# Patient Record
Sex: Male | Born: 1970 | Race: White | Hispanic: No | Marital: Single | Smoking: Never smoker
Health system: Southern US, Community
[De-identification: ages and names within clinical notes are randomized; demographics above are authoritative.]

## PROBLEM LIST (undated history)

## (undated) DIAGNOSIS — E785 Hyperlipidemia, unspecified: Secondary | ICD-10-CM

## (undated) HISTORY — PX: KNEE SURGERY: SHX244

## (undated) HISTORY — DX: Hyperlipidemia, unspecified: E78.5

---

## 2008-04-26 ENCOUNTER — Emergency Department: Payer: Self-pay | Admitting: Emergency Medicine

## 2008-04-26 ENCOUNTER — Other Ambulatory Visit: Payer: Self-pay

## 2008-08-11 DIAGNOSIS — D239 Other benign neoplasm of skin, unspecified: Secondary | ICD-10-CM

## 2008-08-11 HISTORY — DX: Other benign neoplasm of skin, unspecified: D23.9

## 2012-06-01 ENCOUNTER — Emergency Department: Payer: Self-pay | Admitting: Emergency Medicine

## 2012-06-01 LAB — URINALYSIS, COMPLETE
Bacteria: NONE SEEN
Nitrite: NEGATIVE
Ph: 6 (ref 4.5–8.0)
Protein: 30
RBC,UR: 7 /HPF (ref 0–5)
Specific Gravity: 1.027 (ref 1.003–1.030)
WBC UR: 2 /HPF (ref 0–5)

## 2012-06-01 LAB — CBC
HCT: 46.8 % (ref 40.0–52.0)
HGB: 15.8 g/dL (ref 13.0–18.0)
MCHC: 33.7 g/dL (ref 32.0–36.0)
MCV: 87 fL (ref 80–100)
WBC: 8.8 10*3/uL (ref 3.8–10.6)

## 2012-06-01 LAB — BASIC METABOLIC PANEL
Calcium, Total: 9.2 mg/dL (ref 8.5–10.1)
Co2: 22 mmol/L (ref 21–32)
Creatinine: 1.29 mg/dL (ref 0.60–1.30)
Glucose: 100 mg/dL — ABNORMAL HIGH (ref 65–99)
Potassium: 4.1 mmol/L (ref 3.5–5.1)
Sodium: 137 mmol/L (ref 136–145)

## 2013-04-28 DIAGNOSIS — D239 Other benign neoplasm of skin, unspecified: Secondary | ICD-10-CM

## 2013-04-28 HISTORY — DX: Other benign neoplasm of skin, unspecified: D23.9

## 2013-09-17 DIAGNOSIS — H179 Unspecified corneal scar and opacity: Secondary | ICD-10-CM | POA: Insufficient documentation

## 2013-09-17 DIAGNOSIS — H269 Unspecified cataract: Secondary | ICD-10-CM | POA: Insufficient documentation

## 2013-09-17 DIAGNOSIS — H5319 Other subjective visual disturbances: Secondary | ICD-10-CM | POA: Insufficient documentation

## 2015-07-26 ENCOUNTER — Telehealth: Payer: Self-pay | Admitting: Family Medicine

## 2015-07-26 NOTE — Telephone Encounter (Signed)
Spoke with patient and confirmed that we do have access to his [employer] records from Dr. Miles Costain

## 2015-07-26 NOTE — Telephone Encounter (Signed)
Was a patient of Dr Danielle Dess. He has not found a new primary care doctor yet but would like for you to return his call. He would just like to know if we have access to his records from [facility]. I told him that Dr Manuella Ghazi does have access but he would like to make sure we can access them

## 2015-07-30 ENCOUNTER — Encounter: Payer: Self-pay | Admitting: Family Medicine

## 2015-07-30 ENCOUNTER — Ambulatory Visit (INDEPENDENT_AMBULATORY_CARE_PROVIDER_SITE_OTHER): Payer: BLUE CROSS/BLUE SHIELD | Admitting: Family Medicine

## 2015-07-30 ENCOUNTER — Ambulatory Visit
Admission: RE | Admit: 2015-07-30 | Discharge: 2015-07-30 | Disposition: A | Discharge status: Home / Self Care | Payer: BLUE CROSS/BLUE SHIELD | Source: Ambulatory Visit | Attending: Family Medicine | Admitting: Family Medicine

## 2015-07-30 VITALS — BP 110/70 | HR 89 | Temp 98.3°F | Resp 18 | Ht 74.0 in | Wt 206.1 lb

## 2015-07-30 DIAGNOSIS — M799 Soft tissue disorder, unspecified: Secondary | ICD-10-CM | POA: Insufficient documentation

## 2015-07-30 DIAGNOSIS — M7989 Other specified soft tissue disorders: Secondary | ICD-10-CM

## 2015-07-30 MED ORDER — CLINDAMYCIN HCL 300 MG PO CAPS
300.0000 mg | ORAL_CAPSULE | Freq: Four times a day (QID) | ORAL | Status: DC
Start: 2015-07-30 — End: 2021-08-25

## 2015-07-30 MED ORDER — NAPROXEN 500 MG PO TABS: 500.0000 mg | ORAL_TABLET | Freq: Two times a day (BID) | ORAL | Status: DC

## 2015-07-30 NOTE — Progress Notes (Signed)
Name: Victor Mora   MRN: [id number]    DOB: 27-Mar-1971   Date:07/30/2015       Progress Note  Subjective  Chief Complaint  Chief Complaint  Patient presents with  . Acute Visit    Bridge of nose swollen x4 days    HPI   Pt. With pain and swelling over the bridge of the nose. Present for last 5 days. Symptoms have progressively gotten worse over the course of the week. He had similar symptoms 3 weeks ago when he put on a new pair of sunglasses but those went away.  No trauma to nose or head, no nasal drainage, no fevers or chills, but have had headaches. Symptoms have marginally improved this AM but pain was worse last night.  Past Medical History  Diagnosis Date  . Hyperlipidemia     Past Surgical History  Procedure Laterality Date  . Knee surgery Right     History reviewed. No pertinent family history.  Social History   Social History  . Marital Status: Married    Spouse Name: N/A  . Number of Children: N/A  . Years of Education: N/A   Occupational History  . Not on file.   Social History Main Topics  . Smoking status: Never Smoker   . Smokeless tobacco: Not on file  . Alcohol Use: No  . Drug Use: No  . Sexual Activity: Yes   Other Topics Concern  . Not on file   Social History Narrative  . No narrative on file    No current outpatient prescriptions on file.  No Known Allergies   Review of Systems  Constitutional: Negative for fever and chills.  HENT: Negative for congestion, nosebleeds and sore throat.   Neurological: Positive for headaches.   Objective  Filed Vitals:   07/30/15 1058  BP: 110/70  Pulse: 89  Temp: 98.3 F (36.8 C)  TempSrc: Oral  Resp: 18  Height: 6\' 2"  (1.88 m)  Weight: 206 lb 1.6 oz (93.486 kg)  SpO2: 97%    Physical Exam  Constitutional: He is well-developed, well-nourished, and in no distress.  HENT:  Nose: Mucosal edema and sinus tenderness present. No rhinorrhea or nose lacerations. Right sinus exhibits no  frontal sinus tenderness. Left sinus exhibits no frontal sinus tenderness.  Mouth/Throat: No posterior oropharyngeal erythema.  Tenderness to palpation and erythema over the nasal bridge  Cardiovascular: Normal rate and regular rhythm.   Pulmonary/Chest: Effort normal and breath sounds normal.  Nursing note and vitals reviewed.   Assessment & Plan  1. Inflammation of soft tissue Nonspecific inflammation versus infection (cellulitis) at the bridge of the nose. Obtain x-ray of the face, and start patient on antibiotic and NSAID therapy. Obtain CBC for evaluation of white count. Follow-up after lab and imaging workup.  - DG Facial Bones Complete; Future - CBC with Differential - clindamycin (CLEOCIN) 300 MG capsule; Take 1 capsule (300 mg total) by mouth 4 (four) times daily.  Dispense: 28 capsule; Refill: 0 - naproxen (NAPROSYN) 500 MG tablet; Take 1 tablet (500 mg total) by mouth 2 (two) times daily with a meal.  Dispense: 10 tablet; Refill: 0   [facility] 07/30/2015 11:15 AM

## 2015-07-31 LAB — CBC WITH DIFFERENTIAL/PLATELET
Basophils Absolute: 0 10*3/uL (ref 0.0–0.2)
Basos: 0 %
EOS (ABSOLUTE): 0.2 10*3/uL (ref 0.0–0.4)
EOS: 2 %
HEMATOCRIT: 43.6 % (ref 37.5–51.0)
Hemoglobin: 14.9 g/dL (ref 12.6–17.7)
Immature Grans (Abs): 0 10*3/uL (ref 0.0–0.1)
Immature Granulocytes: 0 %
LYMPHS ABS: 2.4 10*3/uL (ref 0.7–3.1)
Lymphs: 25 %
MCH: 29.6 pg (ref 26.6–33.0)
MCHC: 34.2 g/dL (ref 31.5–35.7)
MCV: 87 fL (ref 79–97)
MONOCYTES: 7 %
Monocytes Absolute: 0.7 10*3/uL (ref 0.1–0.9)
NEUTROS ABS: 6.4 10*3/uL (ref 1.4–7.0)
Neutrophils: 66 %
Platelets: 245 10*3/uL (ref 150–379)
RBC: 5.04 x10E6/uL (ref 4.14–5.80)
RDW: 12.8 % (ref 12.3–15.4)
WBC: 9.7 10*3/uL (ref 3.4–10.8)

## 2016-04-14 DIAGNOSIS — N200 Calculus of kidney: Secondary | ICD-10-CM | POA: Insufficient documentation

## 2017-10-17 ENCOUNTER — Other Ambulatory Visit: Payer: Self-pay | Admitting: Sports Medicine

## 2017-10-17 DIAGNOSIS — M7702 Medial epicondylitis, left elbow: Secondary | ICD-10-CM

## 2017-10-24 ENCOUNTER — Other Ambulatory Visit: Payer: Self-pay | Admitting: Sports Medicine

## 2017-10-24 DIAGNOSIS — M7702 Medial epicondylitis, left elbow: Secondary | ICD-10-CM

## 2017-10-26 ENCOUNTER — Other Ambulatory Visit: Payer: BLUE CROSS/BLUE SHIELD

## 2017-10-26 ENCOUNTER — Ambulatory Visit: Payer: BLUE CROSS/BLUE SHIELD

## 2017-10-27 ENCOUNTER — Ambulatory Visit
Admission: RE | Admit: 2017-10-27 | Discharge: 2017-10-27 | Disposition: A | Discharge status: Home / Self Care | Payer: BLUE CROSS/BLUE SHIELD | Source: Ambulatory Visit | Attending: Sports Medicine | Admitting: Sports Medicine

## 2017-10-27 DIAGNOSIS — M7702 Medial epicondylitis, left elbow: Secondary | ICD-10-CM

## 2020-05-24 ENCOUNTER — Ambulatory Visit (INDEPENDENT_AMBULATORY_CARE_PROVIDER_SITE_OTHER): Payer: 59 | Admitting: Dermatology

## 2020-05-24 ENCOUNTER — Other Ambulatory Visit: Payer: Self-pay

## 2020-05-24 DIAGNOSIS — D1800 Hemangioma unspecified site: Secondary | ICD-10-CM

## 2020-05-24 DIAGNOSIS — L820 Inflamed seborrheic keratosis: Secondary | ICD-10-CM

## 2020-05-24 DIAGNOSIS — L814 Other melanin hyperpigmentation: Secondary | ICD-10-CM

## 2020-05-24 DIAGNOSIS — L578 Other skin changes due to chronic exposure to nonionizing radiation: Secondary | ICD-10-CM

## 2020-05-24 DIAGNOSIS — Z1283 Encounter for screening for malignant neoplasm of skin: Secondary | ICD-10-CM

## 2020-05-24 DIAGNOSIS — L821 Other seborrheic keratosis: Secondary | ICD-10-CM

## 2020-05-24 DIAGNOSIS — D229 Melanocytic nevi, unspecified: Secondary | ICD-10-CM

## 2020-05-24 DIAGNOSIS — Z86018 Personal history of other benign neoplasm: Secondary | ICD-10-CM

## 2020-05-24 NOTE — Progress Notes (Signed)
   Follow-Up Visit   Subjective  Victor Mora is a 49 y.o. male who presents for the following: Annual Exam (History of multiple dysplastic nevi - TBSE). The patient presents for Total-Body Skin Exam (TBSE) for skin cancer screening and mole check.  The following portions of the chart were reviewed this encounter and updated as appropriate:  Tobacco  Allergies  Meds  Problems  Med Hx  Surg Hx  Fam Hx      Review of Systems:  No other skin or systemic complaints except as noted in HPI or Assessment and Plan.  Objective  Well appearing patient in no apparent distress; mood and affect are within normal limits.  A full examination was performed including scalp, head, eyes, ears, nose, lips, neck, chest, axillae, abdomen, back, buttocks, bilateral upper extremities, bilateral lower extremities, hands, feet, fingers, toes, fingernails, and toenails. All findings within normal limits unless otherwise noted below.  Objective  Right medial infrascapular x 1, right temple x 3, left temple x 2 (6): Erythematous keratotic or waxy stuck-on papule or plaque.    Assessment & Plan    Lentigines - Scattered tan macules - Discussed due to sun exposure - Benign, observe - Call for any changes  Seborrheic Keratoses - Stuck-on, waxy, tan-brown papules and plaques  - Discussed benign etiology and prognosis. - Observe - Call for any changes  Melanocytic Nevi - Tan-brown and/or pink-flesh-colored symmetric macules and papules - Benign appearing on exam today - Observation - Call clinic for new or changing moles - Recommend daily use of broad spectrum spf 30+ sunscreen to sun-exposed areas.   Hemangiomas - Red papules - Discussed benign nature - Observe - Call for any changes  Actinic Damage - diffuse scaly erythematous macules with underlying dyspigmentation - Recommend daily broad spectrum sunscreen SPF 30+ to sun-exposed areas, reapply every 2 hours as needed.  - Call for new  or changing lesions.  Skin cancer screening performed today.  History of Dysplastic Nevi - No evidence of recurrence today - Recommend regular full body skin exams - Recommend daily broad spectrum sunscreen SPF 30+ to sun-exposed areas, reapply every 2 hours as needed.  - Call if any new or changing lesions are noted between office visits  Inflamed seborrheic keratosis (6) Right medial infrascapular x 1, right temple x 3, left temple x 2  Destruction of lesion - Right medial infrascapular x 1, right temple x 3, left temple x 2 Complexity: simple   Destruction method: cryotherapy   Informed consent: discussed and consent obtained   Timeout:  patient name, date of birth, surgical site, and procedure verified Lesion destroyed using liquid nitrogen: Yes   Region frozen until ice ball extended beyond lesion: Yes   Outcome: patient tolerated procedure well with no complications   Post-procedure details: wound care instructions given    Skin cancer screening  Return in about 6 months (around 11/23/2020).  I, Ashok Cordia, CMA, am acting as scribe for Sarina Ser, MD .  Documentation: I have reviewed the above documentation for accuracy and completeness, and I agree with the above.  Sarina Ser, MD

## 2020-05-24 NOTE — Patient Instructions (Signed)

## 2020-05-27 ENCOUNTER — Encounter: Payer: Self-pay | Admitting: Dermatology

## 2020-11-11 ENCOUNTER — Ambulatory Visit (INDEPENDENT_AMBULATORY_CARE_PROVIDER_SITE_OTHER): Payer: 59 | Admitting: Dermatology

## 2020-11-11 ENCOUNTER — Other Ambulatory Visit: Payer: Self-pay

## 2020-11-11 DIAGNOSIS — L814 Other melanin hyperpigmentation: Secondary | ICD-10-CM | POA: Diagnosis not present

## 2020-11-11 DIAGNOSIS — D1800 Hemangioma unspecified site: Secondary | ICD-10-CM

## 2020-11-11 DIAGNOSIS — D2262 Melanocytic nevi of left upper limb, including shoulder: Secondary | ICD-10-CM | POA: Diagnosis not present

## 2020-11-11 DIAGNOSIS — D229 Melanocytic nevi, unspecified: Secondary | ICD-10-CM

## 2020-11-11 DIAGNOSIS — D485 Neoplasm of uncertain behavior of skin: Secondary | ICD-10-CM

## 2020-11-11 DIAGNOSIS — Z1283 Encounter for screening for malignant neoplasm of skin: Secondary | ICD-10-CM | POA: Diagnosis not present

## 2020-11-11 DIAGNOSIS — L578 Other skin changes due to chronic exposure to nonionizing radiation: Secondary | ICD-10-CM

## 2020-11-11 DIAGNOSIS — D239 Other benign neoplasm of skin, unspecified: Secondary | ICD-10-CM | POA: Insufficient documentation

## 2020-11-11 DIAGNOSIS — Z86018 Personal history of other benign neoplasm: Secondary | ICD-10-CM

## 2020-11-11 DIAGNOSIS — L821 Other seborrheic keratosis: Secondary | ICD-10-CM | POA: Diagnosis not present

## 2020-11-11 NOTE — Patient Instructions (Signed)

## 2020-11-11 NOTE — Progress Notes (Signed)
   Follow-Up Visit   Subjective  Victor Mora is a 49 y.o. male who presents for the following: Annual Exam (Hx dysplastic nevi). The patient presents for Total-Body Skin Exam (TBSE) for skin cancer screening and mole check.  The following portions of the chart were reviewed this encounter and updated as appropriate:   Tobacco  Allergies  Meds  Problems  Med Hx  Surg Hx  Fam Hx     Review of Systems:  No other skin or systemic complaints except as noted in HPI or Assessment and Plan.  Objective  Well appearing patient in no apparent distress; mood and affect are within normal limits.  A full examination was performed including scalp, head, eyes, ears, nose, lips, neck, chest, axillae, abdomen, back, buttocks, bilateral upper extremities, bilateral lower extremities, hands, feet, fingers, toes, fingernails, and toenails. All findings within normal limits unless otherwise noted below.  Objective  L infra axillary: 0.6 cm Irregular brown macule with darker center  Assessment & Plan  Neoplasm of uncertain behavior of skin L infra axillary  Epidermal / dermal shaving  Lesion diameter (cm):  0.6 Informed consent: discussed and consent obtained   Timeout: patient name, date of birth, surgical site, and procedure verified   Procedure prep:  Patient was prepped and draped in usual sterile fashion Prep type:  Isopropyl alcohol Anesthesia: the lesion was anesthetized in a standard fashion   Anesthetic:  1% lidocaine w/ epinephrine 1-100,000 buffered w/ 8.4% NaHCO3 Instrument used: flexible razor blade   Hemostasis achieved with: pressure, aluminum chloride and electrodesiccation   Outcome: patient tolerated procedure well   Post-procedure details: sterile dressing applied and wound care instructions given   Dressing type: bandage and petrolatum    Specimen 1 - Surgical pathology Differential Diagnosis: D48.5 r/o dysplastic nevus  Check Margins: No 0.6 cm Irregular brown macule  with darker center  Skin cancer screening   Lentigines - Scattered tan macules - Discussed due to sun exposure - Benign, observe - Call for any changes  Seborrheic Keratoses - Stuck-on, waxy, tan-brown papules and plaques  - Discussed benign etiology and prognosis. - Observe - Call for any changes  Melanocytic Nevi - Tan-brown and/or pink-flesh-colored symmetric macules and papules - Benign appearing on exam today - Observation - Call clinic for new or changing moles - Recommend daily use of broad spectrum spf 30+ sunscreen to sun-exposed areas.   Hemangiomas - Red papules - Discussed benign nature - Observe - Call for any changes  Actinic Damage - Chronic, secondary to cumulative UV/sun exposure - diffuse scaly erythematous macules with underlying dyspigmentation - Recommend daily broad spectrum sunscreen SPF 30+ to sun-exposed areas, reapply every 2 hours as needed.  - Call for new or changing lesions.  History of Dysplastic Nevi - No evidence of recurrence today - Recommend regular full body skin exams - Recommend daily broad spectrum sunscreen SPF 30+ to sun-exposed areas, reapply every 2 hours as needed.  - Call if any new or changing lesions are noted between office visits  Skin cancer screening performed today.  Return in about 6 months (around 05/12/2021) for TBSE - hx dyplastic nevi .  Luther Redo, CMA, am acting as scribe for Sarina Ser, MD .  Documentation: I have reviewed the above documentation for accuracy and completeness, and I agree with the above.  Sarina Ser, MD

## 2020-11-15 ENCOUNTER — Encounter: Payer: Self-pay | Admitting: Dermatology

## 2020-11-16 ENCOUNTER — Telehealth: Payer: Self-pay

## 2020-11-16 NOTE — Telephone Encounter (Signed)
Discussed biopsy results with pt  °

## 2021-05-05 ENCOUNTER — Encounter: Payer: Self-pay | Admitting: Dermatology

## 2021-05-05 ENCOUNTER — Ambulatory Visit (INDEPENDENT_AMBULATORY_CARE_PROVIDER_SITE_OTHER): Payer: 59 | Admitting: Dermatology

## 2021-05-05 ENCOUNTER — Other Ambulatory Visit: Payer: Self-pay

## 2021-05-05 DIAGNOSIS — D225 Melanocytic nevi of trunk: Secondary | ICD-10-CM

## 2021-05-05 DIAGNOSIS — Z86018 Personal history of other benign neoplasm: Secondary | ICD-10-CM | POA: Diagnosis not present

## 2021-05-05 DIAGNOSIS — L578 Other skin changes due to chronic exposure to nonionizing radiation: Secondary | ICD-10-CM

## 2021-05-05 DIAGNOSIS — L858 Other specified epidermal thickening: Secondary | ICD-10-CM | POA: Diagnosis not present

## 2021-05-05 DIAGNOSIS — Z1283 Encounter for screening for malignant neoplasm of skin: Secondary | ICD-10-CM | POA: Diagnosis not present

## 2021-05-05 DIAGNOSIS — D239 Other benign neoplasm of skin, unspecified: Secondary | ICD-10-CM

## 2021-05-05 DIAGNOSIS — L821 Other seborrheic keratosis: Secondary | ICD-10-CM

## 2021-05-05 DIAGNOSIS — D229 Melanocytic nevi, unspecified: Secondary | ICD-10-CM

## 2021-05-05 DIAGNOSIS — D1800 Hemangioma unspecified site: Secondary | ICD-10-CM

## 2021-05-05 DIAGNOSIS — L814 Other melanin hyperpigmentation: Secondary | ICD-10-CM

## 2021-05-05 NOTE — Patient Instructions (Addendum)
If you have any questions or concerns for your doctor, please call our main line at [phone] and press option 4 to reach your doctor's medical assistant. If no one answers, please leave a voicemail as directed and we will return your call as soon as possible. Messages left after 4 pm will be answered the following business day.   You may also send us a message via MyChart. We typically respond to MyChart messages within 1-2 business days.  For prescription refills, please ask your pharmacy to contact our office. Our fax number is [phone].  If you have an urgent issue when the clinic is closed that cannot wait until the next business day, you can page your doctor at the number below.    Please note that while we do our best to be available for urgent issues outside of office hours, we are not available 24/7.   If you have an urgent issue and are unable to reach us, you may choose to seek medical care at your doctor's office, retail clinic, urgent care center, or emergency room.  If you have a medical emergency, please immediately call 911 or go to the emergency department.  Pager Numbers  - Dr. Kowalski: [phone]  - Dr. Moye: [phone]  - Dr. Stewart: [phone]  In the event of inclement weather, please call our main line at [phone] for an update on the status of any delays or closures.  Dermatology Medication Tips: Please keep the boxes that topical medications come in in order to help keep track of the instructions about where and how to use these. Pharmacies typically print the medication instructions only on the boxes and not directly on the medication tubes.   If your medication is too expensive, please contact our office at [phone] option 4 or send us a message through MyChart.   We are unable to tell what your co-pay for medications will be in advance as this is different depending on your insurance coverage. However, we may be able to find a substitute  medication at lower cost or fill out paperwork to get insurance to cover a needed medication.   If a prior authorization is required to get your medication covered by your insurance company, please allow us 1-2 business days to complete this process.  Drug prices often vary depending on where the prescription is filled and some pharmacies may offer cheaper prices.  The website www.goodrx.com contains coupons for medications through different pharmacies. The prices here do not account for what the cost may be with help from insurance (it may be cheaper with your insurance), but the website can give you the price if you did not use any insurance.  - You can print the associated coupon and take it with your prescription to the pharmacy.  - You may also stop by our office during regular business hours and pick up a GoodRx coupon card.  - If you need your prescription sent electronically to a different pharmacy, notify our office through [facility] MyChart or by phone at [phone] option 4.   Wound Care Instructions  Cleanse wound gently with soap and water once a day then pat dry with clean gauze. Apply a thing coat of Petrolatum (petroleum jelly, "Vaseline") over the wound (unless you have an allergy to this). We recommend that you use a new, sterile tube of Vaseline. Do not pick or remove scabs. Do not remove the yellow or white "healing tissue" from the base of the wound.  Cover the   wound with fresh, clean, nonstick gauze and secure with paper tape. You may use Band-Aids in place of gauze and tape if the would is small enough, but would recommend trimming much of the tape off as there is often too much. Sometimes Band-Aids can irritate the skin.  You should call the office for your biopsy report after 1 week if you have not already been contacted.  If you experience any problems, such as abnormal amounts of bleeding, swelling, significant bruising, significant pain, or evidence of infection,  please call the office immediately.  FOR ADULT SURGERY PATIENTS: If you need something for pain relief you may take 1 extra strength Tylenol (acetaminophen) AND 2 Ibuprofen (200mg each) together every 4 hours as needed for pain. (do not take these if you are allergic to them or if you have a reason you should not take them.) Typically, you may only need pain medication for 1 to 3 days.    

## 2021-05-05 NOTE — Progress Notes (Signed)
Follow-Up Visit   Subjective  Victor Mora is a 50 y.o. male who presents for the following: Total body skin exam (Hx of Dysplastic nevi) and hx of dysplastic nevu with repigmentation (L infra axillary, bx 11/11/20). The patient presents for Total-Body Skin Exam (TBSE) for skin cancer screening and mole check.  The following portions of the chart were reviewed this encounter and updated as appropriate:   Tobacco  Allergies  Meds  Problems  Med Hx  Surg Hx  Fam Hx      Review of Systems:  No other skin or systemic complaints except as noted in HPI or Assessment and Plan.  Objective  Well appearing patient in no apparent distress; mood and affect are within normal limits.  A full examination was performed including scalp, head, eyes, ears, nose, lips, neck, chest, axillae, abdomen, back, buttocks, bilateral upper extremities, bilateral lower extremities, hands, feet, fingers, toes, fingernails, and toenails. All findings within normal limits unless otherwise noted below.  L infra axillary Scar with brown macule within 0.3cm  multiple Scars with no evidence of recurrence.   Bil flanks Tiny follicular keratotic papules.    Assessment & Plan   Lentigines - Scattered tan macules - Due to sun exposure - Benign-appering, observe - Recommend daily broad spectrum sunscreen SPF 30+ to sun-exposed areas, reapply every 2 hours as needed. - Call for any changes  Seborrheic Keratoses - Stuck-on, waxy, tan-brown papules and/or plaques  - Benign-appearing - Discussed benign etiology and prognosis. - Observe - Call for any changes  Melanocytic Nevi - Tan-brown and/or pink-flesh-colored symmetric macules and papules - Benign appearing on exam today - Observation - Call clinic for new or changing moles - Recommend daily use of broad spectrum spf 30+ sunscreen to sun-exposed areas.   Hemangiomas - Red papules - Discussed benign nature - Observe - Call for any  changes  Actinic Damage - Chronic condition, secondary to cumulative UV/sun exposure - diffuse scaly erythematous macules with underlying dyspigmentation - Recommend daily broad spectrum sunscreen SPF 30+ to sun-exposed areas, reapply every 2 hours as needed.  - Staying in the shade or wearing long sleeves, sun glasses (UVA+UVB protection) and wide brim hats (4-inch brim around the entire circumference of the hat) are also recommended for sun protection.  - Call for new or changing lesions.  Skin cancer screening performed today.  Dysplastic nevus L infra axillary  Bx proven 11/11/20 With repigmentation  Skin excision - L infra axillary  Lesion length (cm):  0.3 Lesion width (cm):  0.3 Margin per side (cm):  0 Total excision diameter (cm):  0.3 Informed consent: discussed and consent obtained   Timeout: patient name, date of birth, surgical site, and procedure verified   Procedure prep:  Patient was prepped and draped in usual sterile fashion Prep type:  Isopropyl alcohol Anesthesia: the lesion was anesthetized in a standard fashion   Anesthetic:  1% lidocaine w/ epinephrine 1-100,000 buffered w/ 8.4% NaHCO3 Instrument used comment:  4.61mm punch Hemostasis achieved with: pressure   Hemostasis achieved with comment:  Electrocautery Outcome: patient tolerated procedure well with no complications   Post-procedure details: sterile dressing applied and wound care instructions given   Dressing type: bandage, pressure dressing and bacitracin (Mupirocin)    Skin repair - L infra axillary Complexity:  Simple Final length (cm):  0.3 Informed consent: discussed and consent obtained   Timeout: patient name, date of birth, surgical site, and procedure verified   Procedure prep:  Patient was prepped  and draped in usual sterile fashion Prep type:  Isopropyl alcohol Anesthesia: the lesion was anesthetized in a standard fashion   Anesthetic:  1% lidocaine w/ epinephrine 1-100,000 buffered  w/ 8.4% NaHCO3 Undermining: edges could be approximated without difficulty   Fine/surface layer approximation (top stitches):  Suture size:  4-0 Suture type: nylon   Suture type comment:  Nylon Suture removal (days):  7 Hemostasis achieved with: pressure and electrodesiccation Outcome: patient tolerated procedure well with no complications   Post-procedure details: sterile dressing applied and wound care instructions given   Dressing type: bandage, pressure dressing and bacitracin (Mupirocin)    Specimen 1 - Surgical pathology Differential Diagnosis: D48.5 Repigmented Dysplastic nevus  Check Margins: yes Scar with brown macule within 0.3cm [id number]   History of dysplastic nevus multiple  Clear. Observe for recurrence. Call clinic for new or changing lesions.  Recommend regular skin exams, daily broad-spectrum spf 30+ sunscreen use, and photoprotection.     Keratosis pilaris Bil flanks  Benign, observe  Skin cancer screening  Return in about 1 week (around 05/12/2021) for nurses schedule for suture removal, 16m TBSE , Hx of Dysplastic nevi.  I, [name], RMA, am acting as scribe for Sarina Ser, MD . Documentation: I have reviewed the above documentation for accuracy and completeness, and I agree with the above.  Sarina Ser, MD

## 2021-05-09 ENCOUNTER — Encounter: Payer: Self-pay | Admitting: Dermatology

## 2021-05-12 ENCOUNTER — Telehealth: Payer: Self-pay

## 2021-05-12 ENCOUNTER — Ambulatory Visit: Payer: 59

## 2021-05-12 ENCOUNTER — Other Ambulatory Visit: Payer: Self-pay

## 2021-05-12 DIAGNOSIS — Z4802 Encounter for removal of sutures: Secondary | ICD-10-CM

## 2021-05-12 NOTE — Progress Notes (Signed)
1. Encounter for removal of sutures left infra axillary Incision site is clean, dry and intact   Encounter for Removal of Sutures - Incision site at the left infra axillary is clean, dry and intact - Wound cleansed, sutures removed, wound cleansed, mupirocin and bandage applied.  - Discussed pathology results showing RECURRENT MELANOCYTIC NEVUS, LIMITED MARGINS FREE  - Scars remodel for a full year. - Patient advised to call with any concerns or if they notice any new or changing lesions.

## 2021-05-12 NOTE — Telephone Encounter (Signed)
-----   Message from Ralene Bathe, MD sent at 05/11/2021 12:45 PM EDT ----- Diagnosis Skin , left infra axillary RECURRENT MELANOCYTIC NEVUS, LIMITED MARGINS FREE  Recurrent dysplastic nevus Margins clear

## 2021-05-12 NOTE — Telephone Encounter (Signed)
Advised patient of results/hd  

## 2021-05-16 ENCOUNTER — Ambulatory Visit: Payer: 59 | Admitting: Dermatology

## 2021-07-20 ENCOUNTER — Other Ambulatory Visit: Payer: Self-pay

## 2021-07-20 ENCOUNTER — Encounter: Payer: Self-pay | Admitting: *Deleted

## 2021-07-20 DIAGNOSIS — K760 Fatty (change of) liver, not elsewhere classified: Secondary | ICD-10-CM | POA: Insufficient documentation

## 2021-07-20 DIAGNOSIS — K5790 Diverticulosis of intestine, part unspecified, without perforation or abscess without bleeding: Secondary | ICD-10-CM | POA: Diagnosis not present

## 2021-07-20 DIAGNOSIS — R109 Unspecified abdominal pain: Secondary | ICD-10-CM | POA: Diagnosis present

## 2021-07-20 LAB — COMPREHENSIVE METABOLIC PANEL
ALT: 57 U/L — ABNORMAL HIGH (ref 0–44)
AST: 36 U/L (ref 15–41)
Albumin: 3.9 g/dL (ref 3.5–5.0)
Alkaline Phosphatase: 90 U/L (ref 38–126)
Anion gap: 9 (ref 5–15)
BUN: 14 mg/dL (ref 6–20)
CO2: 23 mmol/L (ref 22–32)
Calcium: 8.8 mg/dL — ABNORMAL LOW (ref 8.9–10.3)
Chloride: 104 mmol/L (ref 98–111)
Creatinine, Ser: 1.08 mg/dL (ref 0.61–1.24)
GFR, Estimated: >60 mL/min (ref >60)
Glucose, Bld: 112 mg/dL — ABNORMAL HIGH (ref 70–99)
Potassium: 3.8 mmol/L (ref 3.5–5.1)
Sodium: 136 mmol/L (ref 135–145)
Total Bilirubin: 1.6 mg/dL — ABNORMAL HIGH (ref 0.3–1.2)
Total Protein: 7.6 g/dL (ref 6.5–8.1)

## 2021-07-20 LAB — CBC
HCT: 47.5 % (ref 39.0–52.0)
Hemoglobin: 16.5 g/dL (ref 13.0–17.0)
MCH: 30 pg (ref 26.0–34.0)
MCHC: 34.7 g/dL (ref 30.0–36.0)
MCV: 86.4 fL (ref 80.0–100.0)
Platelets: 227 10*3/uL (ref 150–400)
RBC: 5.5 MIL/uL (ref 4.22–5.81)
RDW: 12.3 % (ref 11.5–15.5)
WBC: 10.9 10*3/uL — ABNORMAL HIGH (ref 4.0–10.5)
nRBC: 0 % (ref 0.0–0.2)

## 2021-07-20 LAB — TROPONIN I (HIGH SENSITIVITY): Troponin I (High Sensitivity): 4 ng/L (ref <18)

## 2021-07-20 LAB — LIPASE, BLOOD: Lipase: 32 U/L (ref 11–51)

## 2021-07-20 NOTE — ED Triage Notes (Signed)
Reports abd pain since last night.  Pt has nausea.  Decreased appetite.  No vomiting or diarrhea.  Pt alert speech clear.

## 2021-07-21 ENCOUNTER — Emergency Department
Admission: EM | Admit: 2021-07-21 | Discharge: 2021-07-21 | Disposition: A | Discharge status: Home / Self Care | Payer: 59 | Attending: Emergency Medicine | Admitting: Emergency Medicine

## 2021-07-21 ENCOUNTER — Emergency Department: Payer: 59

## 2021-07-21 DIAGNOSIS — R1010 Upper abdominal pain, unspecified: Secondary | ICD-10-CM

## 2021-07-21 DIAGNOSIS — K5790 Diverticulosis of intestine, part unspecified, without perforation or abscess without bleeding: Secondary | ICD-10-CM

## 2021-07-21 DIAGNOSIS — K760 Fatty (change of) liver, not elsewhere classified: Secondary | ICD-10-CM

## 2021-07-21 LAB — URINALYSIS, COMPLETE (UACMP) WITH MICROSCOPIC
Bacteria, UA: NONE SEEN
Bilirubin Urine: NEGATIVE
Glucose, UA: NEGATIVE mg/dL
Hgb urine dipstick: NEGATIVE
Ketones, ur: 5 mg/dL — AB
Leukocytes,Ua: NEGATIVE
Nitrite: NEGATIVE
Protein, ur: NEGATIVE mg/dL
Specific Gravity, Urine: 1.028 (ref 1.005–1.030)
pH: 5 (ref 5.0–8.0)

## 2021-07-21 MED ORDER — SODIUM CHLORIDE 0.9 % IV BOLUS (SEPSIS)
1000.0000 mL | Freq: Once | INTRAVENOUS | Status: AC
  Administered 2021-07-21: 1000 mL via INTRAVENOUS

## 2021-07-21 MED ORDER — ONDANSETRON HCL 4 MG/2ML IJ SOLN
4.0000 mg | Freq: Once | INTRAMUSCULAR | Status: AC
  Administered 2021-07-21: 4 mg via INTRAVENOUS
  Filled 2021-07-21: qty 2

## 2021-07-21 MED ORDER — ONDANSETRON 4 MG PO TBDP: 4.0000 mg | ORAL_TABLET | Freq: Four times a day (QID) | ORAL | 0 refills | Status: DC | PRN

## 2021-07-21 MED ORDER — ALUM & MAG HYDROXIDE-SIMETH 200-200-20 MG/5ML PO SUSP
30.0000 mL | Freq: Once | ORAL | Status: AC
  Administered 2021-07-21: 30 mL via ORAL
  Filled 2021-07-21: qty 30

## 2021-07-21 MED ORDER — PANTOPRAZOLE SODIUM 40 MG PO TBEC: 40.0000 mg | DELAYED_RELEASE_TABLET | Freq: Every day | ORAL | 1 refills | Status: DC

## 2021-07-21 MED ORDER — PANTOPRAZOLE SODIUM 40 MG IV SOLR
40.0000 mg | Freq: Once | INTRAVENOUS | Status: AC
  Administered 2021-07-21: 40 mg via INTRAVENOUS
  Filled 2021-07-21: qty 40

## 2021-07-21 MED ORDER — IOHEXOL 350 MG/ML SOLN
100.0000 mL | Freq: Once | INTRAVENOUS | Status: AC | PRN
  Administered 2021-07-21: 100 mL via INTRAVENOUS

## 2021-07-21 MED ORDER — LIDOCAINE VISCOUS HCL 2 % MT SOLN: 15.0000 mL | OROMUCOSAL | 1 refills | Status: DC | PRN

## 2021-07-21 MED ORDER — LIDOCAINE VISCOUS HCL 2 % MT SOLN
15.0000 mL | Freq: Once | OROMUCOSAL | Status: AC
  Administered 2021-07-21: 15 mL via ORAL
  Filled 2021-07-21: qty 15

## 2021-07-21 MED ORDER — MORPHINE SULFATE (PF) 4 MG/ML IV SOLN
4.0000 mg | Freq: Once | INTRAVENOUS | Status: AC
  Administered 2021-07-21: 4 mg via INTRAVENOUS
  Filled 2021-07-21: qty 1

## 2021-07-21 NOTE — ED Notes (Signed)
MD at the bedside  

## 2021-07-21 NOTE — Discharge Instructions (Addendum)
Please avoid NSAIDs such as aspirin (Goody powders), ibuprofen (Motrin, Advil), naproxen (Aleve) as these may worsen your symptoms.  Tylenol 1000 mg every 6 hours is safe to take as long as you have no history of liver problems (heavy alcohol use, cirrhosis, hepatitis).  Please avoid spicy, acidic (citrus fruits, tomato based sauces, salsa), greasy, fatty foods.  Please avoid caffeine and alcohol.  Smoking can also make GERD/acid reflux worse.  Over the counter medications such as TUMS, Maalox or Mylanta, pepcid.  We are starting you on Protonix to take once daily.  I recommend close follow-up with gastroenterology if symptoms continue.

## 2021-07-21 NOTE — ED Notes (Signed)
Ultrasound at the bedside

## 2021-07-21 NOTE — ED Notes (Signed)
Pt to CT

## 2021-07-21 NOTE — ED Notes (Signed)
Patient return from CT

## 2021-07-21 NOTE — ED Provider Notes (Signed)
[facility] Emergency Department Provider Note  ____________________________________________   Event Date/Time   First MD Initiated Contact with Patient 07/21/21 [phone]     (approximate)  I have reviewed the triage vital signs and the nursing notes.   HISTORY  Chief Complaint Abdominal Pain    HPI Victor Mora is a 50 y.o. male with history of hyperlipidemia who presents to the emergency department with sharp, severe, constant upper abdominal pain that started 24 hours ago.  Has had nausea but no vomiting, diarrhea.  Had a normal bowel movement this morning without blood or melena.  No fevers or chills.  No chest pain or shortness of breath.  No dysuria or hematuria.  No history of abdominal surgeries.  Has never had similar symptoms.  No aggravating or alleviating factors.  Has not tried any medications at home.       Past Medical History:  Diagnosis Date   Dysplastic nevi 04/28/2013   Left costal area, left LQA. Mild atypia   Dysplastic nevi 05/13/2015   Right mid to upper back paraspinal, left superior scapula. Mild atypia.   Dysplastic nevus 08/11/2008   Mid back spinal. Moderate atypia.   Dysplastic nevus 08/25/2009   right medial sole/heel. Slight to moderate atypia. Margins involved.    Dysplastic nevus 08/25/2009   Right pectoral 5.0cm medial to areola. Moderate atypia   Dysplastic nevus 08/30/2010   Mid to upper back spinal sup. Moderate atypia.   Dysplastic nevus 08/30/2010   Mid to upper back spinal inf. Mild atypia   Dysplastic nevus 02/26/2017   Right infrapectoral. Severe atypia, close to margin. Excised: 03/27/2017, margins free.   Dysplastic nevus 04/03/2017   Anterior crown. Mild atypia, lateral and deep margins involved.   Dysplastic nevus 10/14/2019   Left superior posterior deltoid. Moderate atypia, irritated, deep margin involved. Excised: 12/02/2019.   Dysplastic nevus 11/11/2020   left infra axillary,moderate atypia, punch bx  05/05/2021   Dysplastic nevus 05/05/2021   left infra axillary   Hyperlipidemia     Patient Active Problem List   Diagnosis Date Noted   Dysplastic nevus 11/11/2020   Inflammation of soft tissue 07/30/2015    Past Surgical History:  Procedure Laterality Date   KNEE SURGERY Right     Prior to Admission medications   Medication Sig Start Date End Date Taking? Authorizing Provider  lidocaine (XYLOCAINE) 2 % solution Use as directed 15 mLs in the mouth or throat as needed for mouth pain. 07/21/21  Yes Beyza Bellino, Cyril Mourning N, DO  ondansetron (ZOFRAN ODT) 4 MG disintegrating tablet Take 1 tablet (4 mg total) by mouth every 6 (six) hours as needed for nausea or vomiting. 07/21/21  Yes Yachet Mattson, Cyril Mourning N, DO  pantoprazole (PROTONIX) 40 MG tablet Take 1 tablet (40 mg total) by mouth daily. 07/21/21 09/19/21 Yes Alexx Mcburney, Delice Bison, DO  clindamycin (CLEOCIN) 300 MG capsule Take 1 capsule (300 mg total) by mouth 4 (four) times daily. Patient not taking: Reported on 11/11/2020 07/30/15   Roselee Nova, MD  naproxen (NAPROSYN) 500 MG tablet Take 1 tablet (500 mg total) by mouth 2 (two) times daily with a meal. Patient not taking: Reported on 11/11/2020 07/30/15   Roselee Nova, MD    Allergies Patient has no known allergies.  No family history on file.  Social History Social History   Tobacco Use   Smoking status: Never   Smokeless tobacco: Never  Substance Use Topics   Alcohol use: No  Alcohol/week: 0.0 standard drinks   Drug use: No    Review of Systems Constitutional: No fever. Eyes: No visual changes. ENT: No sore throat. Cardiovascular: Denies chest pain. Respiratory: Denies shortness of breath. Gastrointestinal: No nausea, vomiting, diarrhea. Genitourinary: Negative for dysuria. Musculoskeletal: Negative for back pain. Skin: Negative for rash. Neurological: Negative for focal weakness or numbness.  ____________________________________________   PHYSICAL EXAM:  VITAL  SIGNS: ED Triage Vitals  Enc Vitals Group     BP 07/20/21 2141 122/85     Pulse Rate 07/20/21 2141 100     Resp 07/20/21 2141 18     Temp 07/20/21 2141 98.7 F (37.1 C)     Temp Source 07/20/21 2141 Oral     SpO2 07/20/21 2141 97 %     Weight 07/20/21 2142 230 lb (104.3 kg)     Height 07/20/21 2142 '6\' 1"'$  (1.854 m)     Head Circumference --      Peak Flow --      Pain Score 07/20/21 2142 8     Pain Loc --      Pain Edu? --      Excl. in [place]? --    CONSTITUTIONAL: Alert and oriented and responds appropriately to questions.  Appears uncomfortable but nontoxic, afebrile HEAD: Normocephalic EYES: Conjunctivae clear, pupils appear equal, EOM appear intact ENT: normal nose; moist mucous membranes NECK: Supple, normal ROM CARD: RRR; S1 and S2 appreciated; no murmurs, no clicks, no rubs, no gallops RESP: Normal chest excursion without splinting or tachypnea; breath sounds clear and equal bilaterally; no wheezes, no rhonchi, no rales, no hypoxia or respiratory distress, speaking full sentences ABD/GI: Normal bowel sounds; non-distended; soft, diffusely tender throughout the upper abdomen without guarding or rebound, negative Murphy sign, no tenderness at McBurney's point BACK: The back appears normal EXT: Normal ROM in all joints; no deformity noted, no edema; no cyanosis SKIN: Normal color for age and race; warm; no rash on exposed skin NEURO: Moves all extremities equally PSYCH: The patient's mood and manner are appropriate.  ____________________________________________   LABS (all labs ordered are listed, but only abnormal results are displayed)  Labs Reviewed  COMPREHENSIVE METABOLIC PANEL - Abnormal; Notable for the following components:      Result Value   Glucose, Bld 112 (*)    Calcium 8.8 (*)    ALT 57 (*)    Total Bilirubin 1.6 (*)    All other components within normal limits  CBC - Abnormal; Notable for the following components:   WBC 10.9 (*)    All other components  within normal limits  URINALYSIS, COMPLETE (UACMP) WITH MICROSCOPIC - Abnormal; Notable for the following components:   Color, Urine YELLOW (*)    APPearance CLEAR (*)    Ketones, ur 5 (*)    All other components within normal limits  LIPASE, BLOOD  TROPONIN I (HIGH SENSITIVITY)   ____________________________________________  EKG   EKG Interpretation  Date/Time:  Wednesday July 20 2021 21:48:00 EDT Ventricular Rate:  98 PR Interval:  130 QRS Duration: 94 QT Interval:  340 QTC Calculation: 434 R Axis:   68 Text Interpretation: Normal sinus rhythm Incomplete right bundle branch block Borderline ECG No significant change since last tracing Confirmed by Pryor Curia [phone]) on 07/21/2021 1:36:40 AM        ____________________________________________  RADIOLOGY Jessie Foot Quanita Barona, personally viewed and evaluated these images (plain radiographs) as part of my medical decision making, as well as reviewing the written report by the  radiologist.  ED MD interpretation: CT of abdomen pelvis and right upper quadrant ultrasound showed no acute abnormality.  Official radiology report(s): CT ABDOMEN PELVIS W CONTRAST  Result Date: 07/21/2021 CLINICAL DATA:  Abdominal pain for 2 days EXAM: CT ABDOMEN AND PELVIS WITH CONTRAST TECHNIQUE: Multidetector CT imaging of the abdomen and pelvis was performed using the standard protocol following bolus administration of intravenous contrast. CONTRAST:  198m OMNIPAQUE IOHEXOL 350 MG/ML SOLN COMPARISON:  04/27/2008 FINDINGS: Lower chest: No acute abnormality. Hepatobiliary: Liver is diffusely decreased in attenuation consistent with fatty infiltration. Scattered small hypodensities are noted which were not well appreciated on recent ultrasound examination but likely represent small cysts. The gallbladder is within normal limits. Pancreas: Unremarkable. No pancreatic ductal dilatation or surrounding inflammatory changes. Spleen: Normal in size without focal  abnormality. Adrenals/Urinary Tract: Adrenal glands are within normal limits. Kidneys demonstrate a normal enhancement pattern bilaterally. Small right renal cyst is noted. Small 2-3 mm nonobstructing stone is noted in the lower pole of the right kidney. The ureters are within normal limits. The bladder is decompressed. Stomach/Bowel: Scattered mild diverticular changes noted without evidence of diverticulitis. No obstructive changes are seen. The appendix is within normal limits. Small bowel and stomach are unremarkable. Vascular/Lymphatic: No significant vascular findings are present. No enlarged abdominal or pelvic lymph nodes. Reproductive: Prostate is unremarkable. Other: No abdominal wall hernia or abnormality. No abdominopelvic ascites. Musculoskeletal: No acute or significant osseous findings. IMPRESSION: Fatty infiltration of the liver. Scattered hypodensities are noted within the liver which were not well appreciated on the recent ultrasound but likely represent small cysts. Follow-up in 6 months with ultrasound is recommended to assess for stability. Tiny nonobstructing right renal stone. Diverticulosis without diverticulitis. No other focal abnormality is noted. Electronically Signed   By: MInez CatalinaM.D.   On: 07/21/2021 03:19   [place]domen Limited RUQ (LIVER/GB)  Result Date: 07/21/2021 CLINICAL DATA:  Upper abdominal pain for 2 days EXAM: ULTRASOUND ABDOMEN LIMITED RIGHT UPPER QUADRANT COMPARISON:  None. FINDINGS: Gallbladder: No gallstones or wall thickening visualized. No sonographic Murphy sign noted by sonographer. Common bile duct: Diameter: 4.3 mm. Liver: Diffusely increased in echogenicity consistent with fatty infiltration. No focal mass is noted. Portal vein is patent on color Doppler imaging with normal direction of blood flow towards the liver. Other: None. IMPRESSION: Fatty liver. No other focal abnormality is noted. Electronically Signed   By: MInez CatalinaM.D.   On: 07/21/2021 02:19     ____________________________________________   PROCEDURES  Procedure(s) performed (including Critical Care):  Procedures    ____________________________________________   INITIAL IMPRESSION / ASSESSMENT AND PLAN / ED COURSE  As part of my medical decision making, I reviewed the following data [place] obtained from [place], Nursing notes reviewed and incorporated, Labs reviewed , EKG interpreted , Old EKG reviewed, Old chart reviewed, CT and ultrasound reviewed., Notes from prior ED visits, and [place] Controlled Substance Database         Patient here with complaints of upper abdominal pain.  Differential includes cholelithiasis, cholecystitis, choledocholithiasis, cholangitis, pancreatitis, gastritis, GERD, peptic ulcer disease, H. pylori.  Doubt appendicitis, colitis, diverticulitis, bowel obstruction, perforation.  Will give pain and nausea medicine.  Labs do show mild leukocytosis of 10.9 with very minimally elevated ALT and total bilirubin.  Will obtain right upper quadrant ultrasound.  This does not seem to be his anginal equivalent.  Troponin is negative.  EKG shows no changes since 2009.  ED PROGRESS  Right  upper quadrant ultrasound shows fatty liver which may be the cause of his very minimally elevated total bilirubin and ALT today.  No other acute abnormality.  Still complaining of pain.  We will proceed with CT of the abdomen pelvis.  Will give GI cocktail, Protonix and reassess.  CT scan shows no significant abnormality.  Recommended close follow-up with gastroenterology as an outpatient, low-fat diet.  I suspect that this is gastritis versus GERD versus peptic ulcer disease versus H. pylori.  Will discharge with Protonix.  We have discussed his findings of fatty liver disease as well as liver cyst seen on CT imaging and recommended repeat ultrasound to ensure stability in 6 months.  He has a primary care doctor for follow-up.  Patient  also has diverticulosis without diverticulitis.  He has a small stone in the right kidney but no ureterolithiasis.  Have discussed these findings as well.   4:48 AM  Pt feeling better after GI cocktail, Protonix.  Recommended low-fat diet.  Recommended close follow-up with GI if symptoms continue.  Will discharge with viscous lidocaine, Protonix, Zofran.  Patient comfortable with this plan.   At this time, I do not feel there is any life-threatening condition present. I have reviewed, interpreted and discussed all results (EKG, imaging, lab, urine as appropriate) and exam findings with patient/family. I have reviewed nursing notes and appropriate previous records.  I feel the patient is safe to be discharged home without further emergent workup and can continue workup as an outpatient as needed. Discussed usual and customary return precautions. Patient/family verbalize understanding and are comfortable with this plan.  Outpatient follow-up has been provided as needed. All questions have been answered.  ____________________________________________   FINAL CLINICAL IMPRESSION(S) / ED DIAGNOSES  Final diagnoses:  Upper abdominal pain  Fatty liver  Diverticulosis     ED Discharge Orders          Ordered    pantoprazole (PROTONIX) 40 MG tablet  Daily        07/21/21 0444    lidocaine (XYLOCAINE) 2 % solution  As needed        07/21/21 0444    ondansetron (ZOFRAN ODT) 4 MG disintegrating tablet  Every 6 hours PRN        07/21/21 0449            *Please note:  Victor Mora was evaluated in Emergency Department on 07/21/2021 for the symptoms described in the history of present illness. He was evaluated in the context of the global COVID-19 pandemic, which necessitated consideration that the patient might be at risk for infection with the SARS-CoV-2 virus that causes COVID-19. Institutional protocols and algorithms that pertain to the evaluation of patients at risk for COVID-19 are in a  state of rapid change based on information released by regulatory bodies including the CDC and federal and state organizations. These policies and algorithms were followed during the patient's care in the ED.  Some ED evaluations and interventions may be delayed as a result of limited staffing during and the pandemic.*   Note:  This document was prepared using Dragon voice recognition software and may include unintentional dictation errors.    Delson Dulworth, Delice Bison, DO 07/21/21 [id number]

## 2021-07-27 ENCOUNTER — Telehealth: Payer: Self-pay

## 2021-07-27 NOTE — Telephone Encounter (Signed)
LMOM letting patient know that his appt. Has to be cancelled because he has already seen a GI dr.

## 2021-08-22 ENCOUNTER — Other Ambulatory Visit: Payer: Self-pay

## 2021-08-25 ENCOUNTER — Other Ambulatory Visit: Payer: Self-pay

## 2021-08-25 ENCOUNTER — Ambulatory Visit (INDEPENDENT_AMBULATORY_CARE_PROVIDER_SITE_OTHER): Payer: 59 | Admitting: Gastroenterology

## 2021-08-25 ENCOUNTER — Encounter: Payer: Self-pay | Admitting: Gastroenterology

## 2021-08-25 VITALS — BP 126/83 | HR 82 | Temp 98.2°F | Ht 74.0 in | Wt 233.8 lb

## 2021-08-25 DIAGNOSIS — Z1211 Encounter for screening for malignant neoplasm of colon: Secondary | ICD-10-CM

## 2021-08-25 DIAGNOSIS — R932 Abnormal findings on diagnostic imaging of liver and biliary tract: Secondary | ICD-10-CM | POA: Diagnosis not present

## 2021-08-25 DIAGNOSIS — Z8719 Personal history of other diseases of the digestive system: Secondary | ICD-10-CM

## 2021-08-25 DIAGNOSIS — K760 Fatty (change of) liver, not elsewhere classified: Secondary | ICD-10-CM | POA: Diagnosis not present

## 2021-08-25 MED ORDER — PEG 3350-KCL-NA BICARB-NACL 420 G PO SOLR: ORAL | 0 refills | Status: AC

## 2021-08-25 NOTE — Patient Instructions (Signed)
Fatty Liver Disease  The liver converts food into energy, removes toxic material from the blood, makes important proteins, and absorbs necessary vitamins from food. Fatty liver disease occurs when too much fat has built up in your liver cells. Fatty liverdisease is also called hepatic steatosis. In many cases, fatty liver disease does not cause symptoms or problems. It is often diagnosed when tests are being done for other reasons. However, over time, fatty liver can cause inflammation that may lead to more serious liver problems, such as scarring of the liver (cirrhosis) and liver failure. Fatty liver is associated with insulin resistance, increased body fat, high blood pressure (hypertension), and high cholesterol. These are features of metabolic syndrome and increaseyour risk for stroke, diabetes, and heart disease. What are the causes? This condition may be caused by components of metabolic syndrome: Obesity. Insulin resistance. High cholesterol. Other causes: Alcohol abuse. Poor nutrition. Cushing syndrome. Pregnancy. Certain drugs. Poisons. Some viral infections. What increases the risk? You are more likely to develop this condition if you: Abuse alcohol. Are overweight. Have diabetes. Have hepatitis. Have a high triglyceride level. Are pregnant. What are the signs or symptoms? Fatty liver disease often does not cause symptoms. If symptoms do develop, they can include: Fatigue and weakness. Weight loss. Confusion. Nausea, vomiting, or abdominal pain. Yellowing of your skin and the white parts of your eyes (jaundice). Itchy skin. How is this diagnosed? This condition may be diagnosed by: A physical exam and your medical history. Blood tests. Imaging tests, such as an ultrasound, CT scan, or MRI. A liver biopsy. A small sample of liver tissue is removed using a needle. The sample is then looked at under a microscope. How is this treated? Fatty liver disease is often  caused by other health conditions. Treatment for fatty liver may involve medicines and lifestyle changes to manage conditions such as: Alcoholism. High cholesterol. Diabetes. Being overweight or obese. Follow these instructions at home:  Do not drink alcohol. If you have trouble quitting, ask your health care provider how to safely quit with the help of medicine or a supervised program. This is important to keep your condition from getting worse. Eat a healthy diet as told by your health care provider. Ask your health care provider about working with a dietitian to develop an eating plan. Exercise regularly. This can help you lose weight and control your cholesterol and diabetes. Talk to your health care provider about an exercise plan and which activities are best for you. Take over-the-counter and prescription medicines only as told by your health care provider. Keep all follow-up visits. This is important. Contact a health care provider if: You have trouble controlling your: Blood sugar. This is especially important if you have diabetes. Cholesterol. Drinking of alcohol. Get help right away if: You have abdominal pain. You have jaundice. You have nausea and are vomiting. You vomit blood or material that looks like coffee grounds. You have stools that are black, tar-like, or bloody. Summary Fatty liver disease develops when too much fat builds up in the cells of your liver. Fatty liver disease often causes no symptoms or problems. However, over time, fatty liver can cause inflammation that may lead to more serious liver problems, such as scarring of the liver (cirrhosis). You are more likely to develop this condition if you abuse alcohol, are pregnant, are overweight, have diabetes, have hepatitis, or have high triglyceride or cholesterol levels. Contact your health care provider if you have trouble controlling your blood sugar, cholesterol,   or drinking of alcohol. This information is  not intended to replace advice given to you by your health care provider. Make sure you discuss any questions you have with your healthcare provider. Document Revised: 08/26/2020 Document Reviewed: 08/26/2020 Elsevier Patient Education  2022 [employer]  

## 2021-08-25 NOTE — Addendum Note (Signed)
Addended by: Wayna Chalet on: 08/25/2021 04:38 PM   Modules accepted: Orders

## 2021-08-25 NOTE — Progress Notes (Signed)
cyst   Victor Bellows MD, MRCP(U.K) [address]  [place]  [place], [place] [address]  Main: [phone]  Fax: [phone]   Gastroenterology Consultation  Referring Provider:     Idelle Crouch, MD Primary Care Physician:  Idelle Crouch, MD Primary Gastroenterologist:  Dr. Jonathon Mora  Reason for Consultation:             HPI:   Victor Mora is a 50 y.o. y/o male referred for consultation & management  by Dr. Doy Hutching, Leonie Douglas, MD.    Seen in the ER for abdominal pain on 07/21/2021 of 24 hours duration.  He underwent a CT scan of the abdomen that showed fatty infiltration of liver and scattered hypodensities of the liver which were not well appreciated on ultrasound but likely represent small cysts and recommended follow-up in 6 months with ultrasound to assess for stability.  Diverticulosis of the colon was noted tiny nonobstructing renal stone was noted.  Right upper quadrant ultrasound was also performed that showed fatty liver.  Was treated with a GI cocktail commenced on Protonix and discharge.   08/15/2021: Hemoglobin 15.6 g, CMP normal, HbA1c 5.7, TSH normal  He states that a few days prior to his ER presentation had a cookout at home and took to medium risk take and 2 days later himself and a friend of his whom he had cooked for developed epigastric pain followed by nonbloody diarrhea which took him to the emergency room and subsequently resolved over 3 to 5 days.  Denies any NSAID use.  No further recurrence of these episodes.  No other complaints presently.  Never had a colonoscopy.  No family history of colon cancer or polyps.  Past Medical History:  Diagnosis Date   Dysplastic nevi 04/28/2013   Left costal area, left LQA. Mild atypia   Dysplastic nevi 05/13/2015   Right mid to upper back paraspinal, left superior scapula. Mild atypia.   Dysplastic nevus 08/11/2008   Mid back spinal. Moderate atypia.   Dysplastic nevus 08/25/2009   right medial sole/heel.  Slight to moderate atypia. Margins involved.    Dysplastic nevus 08/25/2009   Right pectoral 5.0cm medial to areola. Moderate atypia   Dysplastic nevus 08/30/2010   Mid to upper back spinal sup. Moderate atypia.   Dysplastic nevus 08/30/2010   Mid to upper back spinal inf. Mild atypia   Dysplastic nevus 02/26/2017   Right infrapectoral. Severe atypia, close to margin. Excised: 03/27/2017, margins free.   Dysplastic nevus 04/03/2017   Anterior crown. Mild atypia, lateral and deep margins involved.   Dysplastic nevus 10/14/2019   Left superior posterior deltoid. Moderate atypia, irritated, deep margin involved. Excised: 12/02/2019.   Dysplastic nevus 11/11/2020   left infra axillary,moderate atypia, punch bx 05/05/2021   Dysplastic nevus 05/05/2021   left infra axillary   Hyperlipidemia     Past Surgical History:  Procedure Laterality Date   KNEE SURGERY Right     Prior to Admission medications   Medication Sig Start Date End Date Taking? Authorizing Provider  clindamycin (CLEOCIN) 300 MG capsule Take 1 capsule (300 mg total) by mouth 4 (four) times daily. Patient not taking: Reported on 11/11/2020 07/30/15   Roselee Nova, MD  lidocaine (XYLOCAINE) 2 % solution Use as directed 15 mLs in the mouth or throat as needed for mouth pain. 07/21/21   Ward, Delice Bison, DO  naproxen (NAPROSYN) 500 MG tablet Take 1 tablet (500 mg total) by mouth 2 (two) times  daily with a meal. Patient not taking: Reported on 11/11/2020 07/30/15   Roselee Nova, MD  ondansetron (ZOFRAN ODT) 4 MG disintegrating tablet Take 1 tablet (4 mg total) by mouth every 6 (six) hours as needed for nausea or vomiting. 07/21/21   Ward, Delice Bison, DO  pantoprazole (PROTONIX) 40 MG tablet Take 1 tablet (40 mg total) by mouth daily. 07/21/21 09/19/21  Ward, Delice Bison, DO  VITAMIN B COMPLEX-C PO Take 1 tablet by mouth daily.    [provider]    No family history on file.   Social History   Tobacco Use   Smoking  status: Never   Smokeless tobacco: Never  Substance Use Topics   Alcohol use: No    Alcohol/week: 0.0 standard drinks   Drug use: No    Allergies as of 08/25/2021   (No Known Allergies)    Review of Systems:    All systems reviewed and negative except where noted in HPI.   Physical Exam:  There were no vitals taken for this visit. No LMP for male patient. Psych:  Alert and cooperative. Normal mood and affect. General:   Alert,  Well-developed, well-nourished, pleasant and cooperative in NAD Head:  Normocephalic and atraumatic. Eyes:  Sclera clear, no icterus.   Conjunctiva pink. Abdomen:  Normal bowel sounds.  No bruits.  Soft, non-tender and non-distended without masses, hepatosplenomegaly or hernias noted.  No guarding or rebound tenderness.    Neurologic:  Alert and oriented x3;  grossly normal neurologically. Psych:  Alert and cooperative. Normal mood and affect.  Imaging Studies: No results found.  Assessment and Plan:   Victor Mora is a 50 y.o. y/o male has been referred for abdominal pain that took him to the emergency room in 07/16/2021.  Labs were normal.  CT scan of the abdomen showed no acute abnormalities which would explain the abdominal pain but he had some cysts in the liver which appeared to be benign but would require follow-up.  His history of abdominal pain, diarrhea after consumption of medium rare steak is very suggestive of an infectious etiology.  Has resolved and not recurred.  Reassured that it is benign.  Incidental finding of fatty liver on imaging.  Has not been screened for colon cancer so far.   Plan 1.  Liver cyst seen on CT repeat ultrasound in 6 months time i.e. April 2022 2.  History of abdominal pain likely secondary to infectious enterocolitis likely secondary to consumption of medium rare steak.  Usually benign and self-limiting.  If it recurs to contact us. 3.  Fatty liver incidentally found on imaging: Counseled on lifestyle changes  including improving diet and provided information fatty liver disease and a Mediterranean diet.  Discussed that he needs immunization for hepatitis a and B. 4.  Colon cancer screening: Average risk willing to proceed with colonoscopy.  I have discussed alternative options, risks & benefits,  which include, but are not limited to, bleeding, infection, perforation,respiratory complication & drug reaction.  The patient agrees with this plan & written consent will be obtained.     Follow up in may 2023  Dr Victor Bellows MD,MRCP(U.K)

## 2021-09-07 ENCOUNTER — Other Ambulatory Visit: Payer: Self-pay

## 2021-09-07 ENCOUNTER — Ambulatory Visit (INDEPENDENT_AMBULATORY_CARE_PROVIDER_SITE_OTHER): Payer: 59 | Admitting: Urology

## 2021-09-07 ENCOUNTER — Encounter: Payer: Self-pay | Admitting: Urology

## 2021-09-07 VITALS — BP 124/80 | HR 86 | Ht 74.0 in | Wt 230.0 lb

## 2021-09-07 DIAGNOSIS — Z3009 Encounter for other general counseling and advice on contraception: Secondary | ICD-10-CM

## 2021-09-07 MED ORDER — DIAZEPAM 10 MG PO TABS: 10.0000 mg | ORAL_TABLET | Freq: Once | ORAL | 0 refills | Status: DC | PRN

## 2021-09-07 NOTE — Progress Notes (Signed)
09/07/21 9:02 AM   Victor Mora 1971-05-14 [id number]  CC: Discuss vasectomy  HPI: 50 year old healthy male interested in vasectomy for permanent sterilization.  He has a 49 year old, and does not desire further biologic children.  He denies any urinary symptoms or family history of prostate cancer   PMH: Past Medical History:  Diagnosis Date   Dysplastic nevi 04/28/2013   Left costal area, left LQA. Mild atypia   Dysplastic nevi 05/13/2015   Right mid to upper back paraspinal, left superior scapula. Mild atypia.   Dysplastic nevus 08/11/2008   Mid back spinal. Moderate atypia.   Dysplastic nevus 08/25/2009   right medial sole/heel. Slight to moderate atypia. Margins involved.    Dysplastic nevus 08/25/2009   Right pectoral 5.0cm medial to areola. Moderate atypia   Dysplastic nevus 08/30/2010   Mid to upper back spinal sup. Moderate atypia.   Dysplastic nevus 08/30/2010   Mid to upper back spinal inf. Mild atypia   Dysplastic nevus 02/26/2017   Right infrapectoral. Severe atypia, close to margin. Excised: 03/27/2017, margins free.   Dysplastic nevus 04/03/2017   Anterior crown. Mild atypia, lateral and deep margins involved.   Dysplastic nevus 10/14/2019   Left superior posterior deltoid. Moderate atypia, irritated, deep margin involved. Excised: 12/02/2019.   Dysplastic nevus 11/11/2020   left infra axillary,moderate atypia, punch bx 05/05/2021   Dysplastic nevus 05/05/2021   left infra axillary   Hyperlipidemia     Surgical History: Past Surgical History:  Procedure Laterality Date   KNEE SURGERY Right       Family History: No family history on file.  Social History:  reports that he has never smoked. He has never used smokeless tobacco. He reports that he does not drink alcohol and does not use drugs.  Physical Exam: BP 124/80   Pulse 86   Ht 6\' 2"  (1.88 m)   Wt 230 lb (104.3 kg)   BMI 29.53 kg/m    Constitutional:  Alert and oriented, No acute  distress. Cardiovascular: No clubbing, cyanosis, or edema. Respiratory: Normal respiratory effort, no increased work of breathing. GI: Abdomen is soft, nontender, nondistended, no abdominal masses DRE: Circumcised phallus patent meatus, testicles 20 cc and descended bilaterally without masses, vas deferens easily palpable bilaterally   Assessment & Plan:   50 year old male interested in vasectomy for permanent sterilization.  We discussed the risks and benefits of vasectomy at length.  Vasectomy is intended to be a permanent form of contraception, and does not produce immediate sterility.  Following vasectomy another form of contraception is required until vas occlusion is confirmed by a post-vasectomy semen analysis obtained 2-3 months after the procedure.  Even after vas occlusion is confirmed, vasectomy is not 100% reliable in preventing pregnancy, and the failure rate is approximately 11/1998.  Repeat vasectomy is required in less than 1% of patients.  He should refrain from ejaculation for 1 week after vasectomy.  Options for fertility after vasectomy include vasectomy reversal, and sperm retrieval with in vitro fertilization or ICSI.  These options are not always successful and may be expensive.  Finally, there are other permanent and non-permanent alternatives to vasectomy available. There is no risk of erectile dysfunction, and the volume of semen will be similar to prior, as the majority of the ejaculate is from the prostate and seminal vesicles.   The procedure takes ~20 minutes.  We recommend patients take 5-10 mg of Valium 30 minutes prior, and he will need a driver post-procedure.  Local anesthetic is  injected into the scrotal skin and a small segment of the vas deferens is removed, and the ends occluded. The complication rate is approximately 1-2%, and includes bleeding, infection, and development of chronic scrotal pain.  PLAN: Schedule vasectomy Valium sent to pharmacy  Nickolas Madrid, MD 09/07/2021  [facility] [address] [place], [place] [address] [phone]

## 2021-09-07 NOTE — Patient Instructions (Signed)

## 2021-09-15 ENCOUNTER — Ambulatory Visit: Payer: 59 | Admitting: Anesthesiology

## 2021-09-15 ENCOUNTER — Ambulatory Visit
Admission: RE | Admit: 2021-09-15 | Discharge: 2021-09-15 | Disposition: A | Discharge status: Home / Self Care | Payer: 59 | Attending: Gastroenterology | Admitting: Gastroenterology

## 2021-09-15 ENCOUNTER — Encounter
Admission: RE | Disposition: A | Discharge status: Home / Self Care | Payer: Self-pay | Source: Home / Self Care | Attending: Gastroenterology

## 2021-09-15 ENCOUNTER — Encounter: Payer: Self-pay | Admitting: Gastroenterology

## 2021-09-15 DIAGNOSIS — Z1211 Encounter for screening for malignant neoplasm of colon: Secondary | ICD-10-CM | POA: Diagnosis not present

## 2021-09-15 HISTORY — PX: COLONOSCOPY WITH PROPOFOL: SHX5780

## 2021-09-15 SURGERY — COLONOSCOPY WITH PROPOFOL
Anesthesia: General

## 2021-09-15 MED ORDER — PROPOFOL 10 MG/ML IV BOLUS
INTRAVENOUS | Status: DC | PRN
  Administered 2021-09-15: 100 mg via INTRAVENOUS
  Administered 2021-09-15: 20 mg via INTRAVENOUS

## 2021-09-15 MED ORDER — PROPOFOL 500 MG/50ML IV EMUL
INTRAVENOUS | Status: DC | PRN
  Administered 2021-09-15: 200 ug/kg/min via INTRAVENOUS

## 2021-09-15 MED ORDER — SODIUM CHLORIDE 0.9 % IV SOLN: INTRAVENOUS | Status: DC

## 2021-09-15 NOTE — Transfer of Care (Signed)
Immediate Anesthesia Transfer of Care Note  Patient: KHANH TANORI  Procedure(s) Performed: COLONOSCOPY WITH PROPOFOL  Patient Location: PACU  Anesthesia Type:General  Level of Consciousness: sedated  Airway & Oxygen Therapy: Patient Spontanous Breathing and Patient connected to nasal cannula oxygen  Post-op Assessment: Report given to RN and Post -op Vital signs reviewed and stable  Post vital signs: Reviewed and stable  Last Vitals:  Vitals Value Taken Time  BP 99/61 09/15/21 0951  Temp    Pulse 76 09/15/21 0951  Resp 10 09/15/21 0951  SpO2 97 % 09/15/21 0951    Last Pain:  Vitals:   09/15/21 0909  PainSc: 0-No pain         Complications: No notable events documented.

## 2021-09-15 NOTE — Op Note (Signed)
[facility] Gastroenterology Patient Name: Victor Mora Procedure Date: 09/15/2021 8:38 AM MRN: [id number] Account #: [id number] Date of Birth: [birth year] Admit Type: Outpatient Age: 50 Room: [facility] ENDO ROOM 3 Gender: Male Note Status: Finalized Instrument Name: Jasper Riling [id number] Procedure:             Colonoscopy Indications:           Screening for colorectal malignant neoplasm Providers:             [name] MD, MD Referring MD:          Leonie Douglas. Doy Hutching, MD (Referring MD) Medicines:             Monitored Anesthesia Care Complications:         No immediate complications. Procedure:             Pre-Anesthesia Assessment:                        - Prior to the procedure, a History and Physical was                         performed, and patient medications, allergies and                         sensitivities were reviewed. The patient's tolerance                         of previous anesthesia was reviewed.                        - The risks and benefits of the procedure and the                         sedation options and risks were discussed with the                         patient. All questions were answered and informed                         consent was obtained.                        - ASA Grade Assessment: II - A patient with mild                         systemic disease.                        After obtaining informed consent, the colonoscope was                         passed under direct vision. Throughout the procedure,                         the patient's blood pressure, pulse, and oxygen                         saturations were monitored continuously. The                         Colonoscope was  introduced through the anus and                         advanced to the the cecum, identified by the                         appendiceal orifice. The colonoscopy was performed                         with ease. The patient tolerated the procedure well.                          The quality of the bowel preparation was excellent. Findings:      The perianal and digital rectal examinations were normal.      The entire examined colon appeared normal on direct and retroflexion       views. Impression:            - The entire examined colon is normal on direct and                         retroflexion views.                        - No specimens collected. Recommendation:        - Discharge patient to home (with escort).                        - Resume previous diet.                        - Continue present medications.                        - Repeat colonoscopy in 5 years for screening purposes. Procedure Code(s):     --- Professional ---                        [phone], Colonoscopy, flexible; diagnostic, including                         collection of specimen(s) by brushing or washing, when                         performed (separate procedure) Diagnosis Code(s):     --- Professional ---                        Z12.11, Encounter for screening for malignant neoplasm                         of colon CPT copyright 2019 American Medical Association. All rights reserved. The codes documented in this report are preliminary and upon coder review may  be revised to meet current compliance requirements. [name], MD [name] MD, MD 09/15/2021 9:49:28 AM This report has been signed electronically. Number of Addenda: 0 Note Initiated On: 09/15/2021 8:38 AM Scope Withdrawal Time: 0 hours 10 minutes 46 seconds  Total Procedure Duration: 0 hours 13 minutes 29 seconds  Estimated Blood Loss:  Estimated blood loss: none.      [facility]

## 2021-09-15 NOTE — Anesthesia Preprocedure Evaluation (Addendum)
Anesthesia Evaluation  Patient identified by MRN, date of birth, ID band Patient awake    Reviewed: Allergy & Precautions, NPO status , Patient's Chart, lab work & pertinent test results  History of Anesthesia Complications Negative for: history of anesthetic complications  Airway Mallampati: III  TM Distance: <3 FB Neck ROM: full    Dental  (+) Chipped   Pulmonary neg pulmonary ROS, neg shortness of breath,    Pulmonary exam normal        Cardiovascular Exercise Tolerance: Good (-) angina(-) Past MI and (-) DOE negative cardio ROS Normal cardiovascular exam     Neuro/Psych negative neurological ROS  negative psych ROS   GI/Hepatic negative GI ROS, Neg liver ROS, neg GERD  ,  Endo/Other  negative endocrine ROS  Renal/GU Renal disease  negative genitourinary   Musculoskeletal   Abdominal   Peds  Hematology negative hematology ROS (+)   Anesthesia Other Findings Past Medical History: 04/28/2013: Dysplastic nevi     Comment:  Left costal area, left LQA. Mild atypia 05/13/2015: Dysplastic nevi     Comment:  Right mid to upper back paraspinal, left superior               scapula. Mild atypia. 08/11/2008: Dysplastic nevus     Comment:  Mid back spinal. Moderate atypia. 08/25/2009: Dysplastic nevus     Comment:  right medial sole/heel. Slight to moderate atypia.               Margins involved.  08/25/2009: Dysplastic nevus     Comment:  Right pectoral 5.0cm medial to areola. Moderate atypia 08/30/2010: Dysplastic nevus     Comment:  Mid to upper back spinal sup. Moderate atypia. 08/30/2010: Dysplastic nevus     Comment:  Mid to upper back spinal inf. Mild atypia 02/26/2017: Dysplastic nevus     Comment:  Right infrapectoral. Severe atypia, close to margin.               Excised: 03/27/2017, margins free. 04/03/2017: Dysplastic nevus     Comment:  Anterior crown. Mild atypia, lateral and deep margins                involved. 10/14/2019: Dysplastic nevus     Comment:  Left superior posterior deltoid. Moderate atypia,               irritated, deep margin involved. Excised: 12/02/2019. 11/11/2020: Dysplastic nevus     Comment:  left infra axillary,moderate atypia, punch bx 05/05/2021 05/05/2021: Dysplastic nevus     Comment:  left infra axillary No date: Hyperlipidemia  Past Surgical History: No date: KNEE SURGERY; Right  BMI    Body Mass Index: 29.53 kg/m      Reproductive/Obstetrics negative OB ROS                             Anesthesia Physical Anesthesia Plan  ASA: 2  Anesthesia Plan: General   Post-op Pain Management:    Induction: Intravenous  PONV Risk Score and Plan: Propofol infusion and TIVA  Airway Management Planned: Natural Airway and Nasal Cannula  Additional Equipment:   Intra-op Plan:   Post-operative Plan:   Informed Consent: I have reviewed the patients History and Physical, chart, labs and discussed the procedure including the risks, benefits and alternatives for the proposed anesthesia with the patient or authorized representative who has indicated his/her understanding and acceptance.     Dental Advisory Given  Plan Discussed with: Anesthesiologist, CRNA and Surgeon  Anesthesia Plan Comments: (Patient and wife consented for risks of anesthesia including but not limited to:  - adverse reactions to medications - risk of airway placement if required - damage to eyes, teeth, lips or other oral mucosa - nerve damage due to positioning  - sore throat or hoarseness - Damage to heart, brain, nerves, lungs, other parts of body or loss of life  They voiced understanding.)       Anesthesia Quick Evaluation

## 2021-09-16 ENCOUNTER — Encounter: Payer: Self-pay | Admitting: Gastroenterology

## 2021-09-16 NOTE — Anesthesia Postprocedure Evaluation (Signed)
Anesthesia Post Note  Patient: Victor Mora  Procedure(s) Performed: COLONOSCOPY WITH PROPOFOL  Patient location during evaluation: Endoscopy Anesthesia Type: General Level of consciousness: awake and alert Pain management: pain level controlled Vital Signs Assessment: post-procedure vital signs reviewed and stable Respiratory status: spontaneous breathing, nonlabored ventilation, respiratory function stable and patient connected to nasal cannula oxygen Cardiovascular status: blood pressure returned to baseline and stable Postop Assessment: no apparent nausea or vomiting Anesthetic complications: no   No notable events documented.   Last Vitals:  Vitals:   09/15/21 1001 09/15/21 1011  BP: 101/61 117/76  Pulse: 89 75  Resp: 10 16  Temp:  (!) 35.7 C  SpO2: 97% 98%    Last Pain:  Vitals:   09/15/21 1011  TempSrc: Temporal  PainSc: 0-No pain                 Precious Haws Cherl Gorney

## 2021-09-20 NOTE — H&P (Signed)
[name], MD [address] [place], [place], [place], [address] [address], [place], [place], [place], [address] Phone: [phone]  Fax: [phone]  Primary Care Physician:  Idelle Crouch, MD   Pre-Procedure History & Physical: HPI:  Victor Mora is a 50 y.o. male is here for an colonoscopy.   Past Medical History:  Diagnosis Date   Dysplastic nevi 04/28/2013   Left costal area, left LQA. Mild atypia   Dysplastic nevi 05/13/2015   Right mid to upper back paraspinal, left superior scapula. Mild atypia.   Dysplastic nevus 08/11/2008   Mid back spinal. Moderate atypia.   Dysplastic nevus 08/25/2009   right medial sole/heel. Slight to moderate atypia. Margins involved.    Dysplastic nevus 08/25/2009   Right pectoral 5.0cm medial to areola. Moderate atypia   Dysplastic nevus 08/30/2010   Mid to upper back spinal sup. Moderate atypia.   Dysplastic nevus 08/30/2010   Mid to upper back spinal inf. Mild atypia   Dysplastic nevus 02/26/2017   Right infrapectoral. Severe atypia, close to margin. Excised: 03/27/2017, margins free.   Dysplastic nevus 04/03/2017   Anterior crown. Mild atypia, lateral and deep margins involved.   Dysplastic nevus 10/14/2019   Left superior posterior deltoid. Moderate atypia, irritated, deep margin involved. Excised: 12/02/2019.   Dysplastic nevus 11/11/2020   left infra axillary,moderate atypia, punch bx 05/05/2021   Dysplastic nevus 05/05/2021   left infra axillary   Hyperlipidemia     Past Surgical History:  Procedure Laterality Date   COLONOSCOPY WITH PROPOFOL N/A 09/15/2021   Procedure: COLONOSCOPY WITH PROPOFOL;  Surgeon: [name], MD;  Location: [facility] ENDOSCOPY;  Service: Gastroenterology;  Laterality: N/A;   KNEE SURGERY Right     Prior to Admission medications   Medication Sig Start Date End Date Taking? Authorizing Provider  diazepam (VALIUM) 10 MG tablet Take 1 tablet (10 mg total) by mouth once as needed for up to 1  dose for anxiety (take 30 minutes prior to vasectomy). 09/07/21   Billey Co, MD  polyethylene glycol-electrolytes (NULYTELY) 420 g solution Prepare according to package instructions. Starting at 5:00 PM: Drink one 8 oz glass of mixture every 15 minutes until you finish half of the jug. Five hours prior to procedure, drink 8 oz glass of mixture every 15 minutes until it is all gone. Make sure you do not drink anything 4 hours prior to your procedure. 08/25/21   [name], MD    Allergies as of 08/26/2021   (No Known Allergies)    History reviewed. No pertinent family history.  Social History   Socioeconomic History   Marital status: Single    Spouse name: Not on file   Number of children: Not on file   Years of education: Not on file   Highest education level: Not on file  Occupational History   Not on file  Tobacco Use   Smoking status: Never   Smokeless tobacco: Never  Vaping Use   Vaping Use: Never used  Substance and Sexual Activity   Alcohol use: No    Alcohol/week: 0.0 standard drinks   Drug use: No   Sexual activity: Yes  Other Topics Concern   Not on file  Social History Narrative   Not on file   Social Determinants of Health   Financial Resource Strain: Not on file  Food Insecurity: Not on file  Transportation Needs: Not on file  Physical Activity: Not on file  Stress: Not on  file  Social Connections: Not on file  Intimate Partner Violence: Not on file    Review of Systems: See HPI, otherwise negative ROS  Physical Exam: BP 117/76   Pulse 75   Temp (!) 96.3 F (35.7 C) (Temporal)   Resp 16   Ht 6\' 2"  (1.88 m)   Wt 104.3 kg   SpO2 98%   BMI 29.53 kg/m  General:   Alert,  pleasant and cooperative in NAD Head:  Normocephalic and atraumatic. Neck:  Supple; no masses or thyromegaly. Lungs:  Clear throughout to auscultation, normal respiratory effort.    Heart:  +S1, +S2, Regular rate and rhythm, No edema. Abdomen:  Soft, nontender and  nondistended. Normal bowel sounds, without guarding, and without rebound.   Neurologic:  Alert and  oriented x4;  grossly normal neurologically.  Impression/Plan: Victor Mora is here for an colonoscopy to be performed for Screening colonoscopy average risk   Risks, benefits, limitations, and alternatives regarding  colonoscopy have been reviewed with the patient.  Questions have been answered.  All parties agreeable.  Note has been put ion on 09/20/2021 as I seemed to have missed putting it on day of procedure [name], MD  09/20/2021, 11:11 AM

## 2021-09-28 ENCOUNTER — Encounter: Payer: 59 | Admitting: Urology

## 2021-09-29 ENCOUNTER — Encounter: Payer: Self-pay | Admitting: Urology

## 2021-09-29 ENCOUNTER — Ambulatory Visit (INDEPENDENT_AMBULATORY_CARE_PROVIDER_SITE_OTHER): Payer: 59 | Admitting: Urology

## 2021-09-29 ENCOUNTER — Other Ambulatory Visit: Payer: Self-pay

## 2021-09-29 VITALS — BP 117/76 | HR 75 | Ht 73.0 in | Wt 230.0 lb

## 2021-09-29 DIAGNOSIS — Z3009 Encounter for other general counseling and advice on contraception: Secondary | ICD-10-CM | POA: Diagnosis not present

## 2021-09-29 DIAGNOSIS — Z9852 Vasectomy status: Secondary | ICD-10-CM

## 2021-09-29 NOTE — Patient Instructions (Signed)

## 2021-09-29 NOTE — Progress Notes (Signed)
VASECTOMY PROCEDURE NOTE:  The patient was taken to the minor procedure room and placed in the supine position. His genitals were prepped and draped in the usual sterile fashion. The right vas deferens was brought up to the skin of the right upper scrotum. The skin overlying it was anesthetized with 1% lidocaine without epinephrine, anesthetic was also injected alongside the vas deferens in the direction of the inguinal canal. The no scalpel vasectomy instrument was used to make a small perforation in the scrotal skin. The vasectomy clamp was used to grasp the vas deferens. It was carefully dissected free from surrounding structures. A 1cm segment of the vas was removed, and the cut ends of the mucosa were cauterized. A figure of eight suture was used to perform fascial interposition. No significant bleeding was noted. The vas deferens was returned to the scrotum. The skin incision was closed with a simple interrupted stitch of 4-0 chromic.  Attention was then turned to the left side. The left vasectomy was performed in the same exact fashion. Sterile dressings were placed over each incision. The patient tolerated the procedure well.  IMPRESSION/DIAGNOSIS: The patient is a 50 year-old gentleman who underwent a vasectomy today. Post-procedure instructions were reviewed. I stressed the importance of continuing to use birth control until he provides a semen specimen more than 2 months from now that demonstrates azoospermia.  We discussed return precautions including fever over 101, significant bleeding or hematoma, or uncontrolled pain. I also stressed the importance of avoiding strenuous activity for one week, no sexual activity or ejaculations for 5 days, intermittent icing over the next 48 hours, and scrotal support.   PLAN: The patient will be advised of his semen analysis results when available.   Nickolas Madrid, MD 09/29/2021

## 2021-10-05 ENCOUNTER — Telehealth: Payer: Self-pay | Admitting: Urology

## 2021-10-05 NOTE — Telephone Encounter (Signed)
Patient called the office to report post vasectomy pain.  Patient had vasectomy on 11/3 with Dr. Diamantina Providence.    He reports that the pain has not gotten any worse, but has been consistent since the day of the procedure.    I advised the patient that pain can last 7-10 days after the procedure, he should continue to use scrotal support, limit his physical activity, use ice and alternate over the counter NSAIDS for pain.  Patient stated that he felt reassured.  He will continue with the recommendations above and if not better after 10 days, he will call the office to make an appt with a PA.

## 2021-11-09 ENCOUNTER — Other Ambulatory Visit: Payer: Self-pay

## 2021-11-09 ENCOUNTER — Ambulatory Visit (INDEPENDENT_AMBULATORY_CARE_PROVIDER_SITE_OTHER): Payer: 59 | Admitting: Dermatology

## 2021-11-09 DIAGNOSIS — L821 Other seborrheic keratosis: Secondary | ICD-10-CM

## 2021-11-09 DIAGNOSIS — D2239 Melanocytic nevi of other parts of face: Secondary | ICD-10-CM | POA: Diagnosis not present

## 2021-11-09 DIAGNOSIS — L814 Other melanin hyperpigmentation: Secondary | ICD-10-CM

## 2021-11-09 DIAGNOSIS — Z86018 Personal history of other benign neoplasm: Secondary | ICD-10-CM

## 2021-11-09 DIAGNOSIS — D2222 Melanocytic nevi of left ear and external auricular canal: Secondary | ICD-10-CM

## 2021-11-09 DIAGNOSIS — D2221 Melanocytic nevi of right ear and external auricular canal: Secondary | ICD-10-CM

## 2021-11-09 DIAGNOSIS — D229 Melanocytic nevi, unspecified: Secondary | ICD-10-CM

## 2021-11-09 DIAGNOSIS — D1800 Hemangioma unspecified site: Secondary | ICD-10-CM

## 2021-11-09 DIAGNOSIS — L578 Other skin changes due to chronic exposure to nonionizing radiation: Secondary | ICD-10-CM

## 2021-11-09 DIAGNOSIS — Z1283 Encounter for screening for malignant neoplasm of skin: Secondary | ICD-10-CM

## 2021-11-09 NOTE — Patient Instructions (Signed)

## 2021-11-09 NOTE — Progress Notes (Signed)
Follow-Up Visit   Subjective  Victor Mora is a 50 y.o. male who presents for the following: Annual Exam (Mole check ). 6 months mole check hx of Dysplastic nevus. The patient presents for Total-Body Skin Exam (TBSE) for skin cancer screening and mole check.  The patient has spots, moles and lesions to be evaluated, some may be new or changing and the patient has concerns that these could be cancer.   The following portions of the chart were reviewed this encounter and updated as appropriate:   Tobacco   Allergies   Meds   Problems   Med Hx   Surg Hx   Fam Hx      Review of Systems:  No other skin or systemic complaints except as noted in HPI or Assessment and Plan.  Objective  Well appearing patient in no apparent distress; mood and affect are within normal limits.  A full examination was performed including scalp, head, eyes, ears, nose, lips, neck, chest, axillae, abdomen, back, buttocks, bilateral upper extremities, bilateral lower extremities, hands, feet, fingers, toes, fingernails, and toenails. All findings within normal limits unless otherwise noted below.  right nose 1.3 flesh papule      ears Tan-brown and/or pink-flesh-colored symmetric macules and papules.          Assessment & Plan  Fibrous papule of nose right nose Benign-appearing.  Observation.  Call clinic for new or changing moles.  Recommend daily use of broad spectrum spf 30+ sunscreen to sun-exposed areas.   Recommend removal if changes.  Nevus ears See photos Benign-appearing.  Observation.  Call clinic for new or changing moles.  Recommend daily use of broad spectrum spf 30+ sunscreen to sun-exposed areas.    Skin cancer screening  Lentigines - Scattered tan macules - Due to sun exposure - Benign-appearing, observe - Recommend daily broad spectrum sunscreen SPF 30+ to sun-exposed areas, reapply every 2 hours as needed. - Call for any changes  Seborrheic Keratoses - Stuck-on, waxy,  tan-brown papules and/or plaques  - Benign-appearing - Discussed benign etiology and prognosis. - Observe - Call for any changes  Melanocytic Nevi - Tan-brown and/or pink-flesh-colored symmetric macules and papules - Benign appearing on exam today - Observation - Call clinic for new or changing moles - Recommend daily use of broad spectrum spf 30+ sunscreen to sun-exposed areas.   Hemangiomas - Red papules - Discussed benign nature - Observe - Call for any changes  Actinic Damage - Chronic condition, secondary to cumulative UV/sun exposure - diffuse scaly erythematous macules with underlying dyspigmentation - Recommend daily broad spectrum sunscreen SPF 30+ to sun-exposed areas, reapply every 2 hours as needed.  - Staying in the shade or wearing long sleeves, sun glasses (UVA+UVB protection) and wide brim hats (4-inch brim around the entire circumference of the hat) are also recommended for sun protection.  - Call for new or changing lesions.  History of Dysplastic Nevi - No evidence of recurrence today - Recommend regular full body skin exams - Recommend daily broad spectrum sunscreen SPF 30+ to sun-exposed areas, reapply every 2 hours as needed.  - Call if any new or changing lesions are noted between office visits  Skin cancer screening performed today.   Return in about 6 months (around 05/10/2022) for TBSE hx of Dysplastic nevus .  IMarye Round, CMA, am acting as scribe for Sarina Ser, MD .  Documentation: I have reviewed the above documentation for accuracy and completeness, and I agree with the above.  Sarina Ser, MD

## 2021-11-14 ENCOUNTER — Encounter: Payer: Self-pay | Admitting: Dermatology

## 2021-12-30 ENCOUNTER — Other Ambulatory Visit: Payer: Self-pay

## 2021-12-30 ENCOUNTER — Other Ambulatory Visit: Payer: Self-pay | Admitting: *Deleted

## 2021-12-30 ENCOUNTER — Other Ambulatory Visit: Payer: 59

## 2021-12-30 DIAGNOSIS — Z9852 Vasectomy status: Secondary | ICD-10-CM

## 2022-01-01 LAB — POST-VAS SPERM EVALUATION,QUAL: Volume: 1.2 mL

## 2022-04-19 DIAGNOSIS — M25541 Pain in joints of right hand: Secondary | ICD-10-CM | POA: Diagnosis not present

## 2022-04-19 DIAGNOSIS — S6000XA Contusion of unspecified finger without damage to nail, initial encounter: Secondary | ICD-10-CM | POA: Diagnosis not present

## 2022-05-16 ENCOUNTER — Ambulatory Visit: Payer: 59 | Admitting: Dermatology

## 2022-05-16 DIAGNOSIS — D1800 Hemangioma unspecified site: Secondary | ICD-10-CM | POA: Diagnosis not present

## 2022-05-16 DIAGNOSIS — Z86018 Personal history of other benign neoplasm: Secondary | ICD-10-CM | POA: Diagnosis not present

## 2022-05-16 DIAGNOSIS — L858 Other specified epidermal thickening: Secondary | ICD-10-CM | POA: Diagnosis not present

## 2022-05-16 DIAGNOSIS — D2339 Other benign neoplasm of skin of other parts of face: Secondary | ICD-10-CM | POA: Diagnosis not present

## 2022-05-16 DIAGNOSIS — Z1283 Encounter for screening for malignant neoplasm of skin: Secondary | ICD-10-CM

## 2022-05-16 DIAGNOSIS — D2330 Other benign neoplasm of skin of unspecified part of face: Secondary | ICD-10-CM

## 2022-05-16 DIAGNOSIS — L821 Other seborrheic keratosis: Secondary | ICD-10-CM | POA: Diagnosis not present

## 2022-05-16 DIAGNOSIS — L578 Other skin changes due to chronic exposure to nonionizing radiation: Secondary | ICD-10-CM

## 2022-05-16 DIAGNOSIS — L814 Other melanin hyperpigmentation: Secondary | ICD-10-CM

## 2022-05-16 DIAGNOSIS — D229 Melanocytic nevi, unspecified: Secondary | ICD-10-CM

## 2022-05-16 NOTE — Patient Instructions (Signed)
Due to recent changes in healthcare laws, you may see results of your pathology and/or laboratory studies on MyChart before the doctors have had a chance to review them. We understand that in some cases there may be results that are confusing or concerning to you. Please understand that not all results are received at the same time and often the doctors may need to interpret multiple results in order to provide you with the best plan of care or course of treatment. Therefore, we ask that you please give us 2 business days to thoroughly review all your results before contacting the office for clarification. Should we see a critical lab result, you will be contacted sooner.   If You Need Anything After Your Visit  If you have any questions or concerns for your doctor, please call our main line at [phone] and press option 4 to reach your doctor's medical assistant. If no one answers, please leave a voicemail as directed and we will return your call as soon as possible. Messages left after 4 pm will be answered the following business day.   You may also send us a message via MyChart. We typically respond to MyChart messages within 1-2 business days.  For prescription refills, please ask your pharmacy to contact our office. Our fax number is [phone].  If you have an urgent issue when the clinic is closed that cannot wait until the next business day, you can page your doctor at the number below.    Please note that while we do our best to be available for urgent issues outside of office hours, we are not available 24/7.   If you have an urgent issue and are unable to reach us, you may choose to seek medical care at your doctor's office, retail clinic, urgent care center, or emergency room.  If you have a medical emergency, please immediately call 911 or go to the emergency department.  Pager Numbers  - Dr. Kowalski: [phone]  - Dr. Moye: [phone]  - Dr. Stewart:  [phone]  In the event of inclement weather, please call our main line at [phone] for an update on the status of any delays or closures.  Dermatology Medication Tips: Please keep the boxes that topical medications come in in order to help keep track of the instructions about where and how to use these. Pharmacies typically print the medication instructions only on the boxes and not directly on the medication tubes.   If your medication is too expensive, please contact our office at [phone] option 4 or send us a message through MyChart.   We are unable to tell what your co-pay for medications will be in advance as this is different depending on your insurance coverage. However, we may be able to find a substitute medication at lower cost or fill out paperwork to get insurance to cover a needed medication.   If a prior authorization is required to get your medication covered by your insurance company, please allow us 1-2 business days to complete this process.  Drug prices often vary depending on where the prescription is filled and some pharmacies may offer cheaper prices.  The website www.goodrx.com contains coupons for medications through different pharmacies. The prices here do not account for what the cost may be with help from insurance (it may be cheaper with your insurance), but the website can give you the price if you did not use any insurance.  - You can print the associated coupon and take it with   your prescription to the pharmacy.  - You may also stop by our office during regular business hours and pick up a GoodRx coupon card.  - If you need your prescription sent electronically to a different pharmacy, notify our office through [facility] MyChart or by phone at [phone] option 4.     Si Usted Necesita Algo Despus de Su Visita  Tambin puede enviarnos un mensaje a travs de MyChart. Por lo general respondemos a los mensajes de MyChart en el transcurso de 1 a 2  das hbiles.  Para renovar recetas, por favor pida a su farmacia que se ponga en contacto con nuestra oficina. Nuestro nmero de fax es el [phone].  Si tiene un asunto urgente cuando la clnica est cerrada y que no puede esperar hasta el siguiente da hbil, puede llamar/localizar a su doctor(a) al nmero que aparece a continuacin.   Por favor, tenga en cuenta que aunque hacemos todo lo posible para estar disponibles para asuntos urgentes fuera del horario de oficina, no estamos disponibles las 24 horas del da, los 7 das de la semana.   Si tiene un problema urgente y no puede comunicarse con nosotros, puede optar por buscar atencin mdica  en el consultorio de su doctor(a), en una clnica privada, en un centro de atencin urgente o en una sala de emergencias.  Si tiene una emergencia mdica, por favor llame inmediatamente al 911 o vaya a la sala de emergencias.  Nmeros de bper  - Dr. Kowalski: [phone]  - Dra. Moye: [phone]  - Dra. Stewart: [phone]  En caso de inclemencias del tiempo, por favor llame a nuestra lnea principal al [phone] para una actualizacin sobre el estado de cualquier retraso o cierre.  Consejos para la medicacin en dermatologa: Por favor, guarde las cajas en las que vienen los medicamentos de uso tpico para ayudarle a seguir las instrucciones sobre dnde y cmo usarlos. Las farmacias generalmente imprimen las instrucciones del medicamento slo en las cajas y no directamente en los tubos del medicamento.   Si su medicamento es muy caro, por favor, pngase en contacto con nuestra oficina llamando al [phone] y presione la opcin 4 o envenos un mensaje a travs de MyChart.   No podemos decirle cul ser su copago por los medicamentos por adelantado ya que esto es diferente dependiendo de la cobertura de su seguro. Sin embargo, es posible que podamos encontrar un medicamento sustituto a menor costo o llenar un formulario para que el  seguro cubra el medicamento que se considera necesario.   Si se requiere una autorizacin previa para que su compaa de seguros cubra su medicamento, por favor permtanos de 1 a 2 das hbiles para completar este proceso.  Los precios de los medicamentos varan con frecuencia dependiendo del lugar de dnde se surte la receta y alguna farmacias pueden ofrecer precios ms baratos.  El sitio web www.goodrx.com tiene cupones para medicamentos de diferentes farmacias. Los precios aqu no tienen en cuenta lo que podra costar con la ayuda del seguro (puede ser ms barato con su seguro), pero el sitio web puede darle el precio si no utiliz ningn seguro.  - Puede imprimir el cupn correspondiente y llevarlo con su receta a la farmacia.  - Tambin puede pasar por nuestra oficina durante el horario de atencin regular y recoger una tarjeta de cupones de GoodRx.  - Si necesita que su receta se enve electrnicamente a una farmacia diferente, informe a nuestra oficina a travs de MyChart de [facility]   o por telfono llamando al [phone] y presione la opcin 4.  

## 2022-05-16 NOTE — Progress Notes (Signed)
   Follow-Up Visit   Subjective  Victor Mora is a 51 y.o. male who presents for the following: Total body skin exam (Hx of Dysplastic nevi). The patient presents for Total-Body Skin Exam (TBSE) for skin cancer screening and mole check.  The patient has spots, moles and lesions to be evaluated, some may be new or changing and the patient has concerns that these could be cancer.   The following portions of the chart were reviewed this encounter and updated as appropriate:   Tobacco  Allergies  Meds  Problems  Med Hx  Surg Hx  Fam Hx     Review of Systems:  No other skin or systemic complaints except as noted in HPI or Assessment and Plan.  Objective  Well appearing patient in no apparent distress; mood and affect are within normal limits.  A full examination was performed including scalp, head, eyes, ears, nose, lips, neck, chest, axillae, abdomen, back, buttocks, bilateral upper extremities, bilateral lower extremities, hands, feet, fingers, toes, fingernails, and toenails. All findings within normal limits unless otherwise noted below.  R nose 0.3cm flesh pap   Assessment & Plan   History of Dysplastic Nevi - No evidence of recurrence today - Recommend regular full body skin exams - Recommend daily broad spectrum sunscreen SPF 30+ to sun-exposed areas, reapply every 2 hours as needed.  - Call if any new or changing lesions are noted between office visits  Lentigines - Scattered tan macules - Due to sun exposure - Benign-appearing, observe - Recommend daily broad spectrum sunscreen SPF 30+ to sun-exposed areas, reapply every 2 hours as needed. - Call for any changes  Seborrheic Keratoses - Stuck-on, waxy, tan-brown papules and/or plaques  - Benign-appearing - Discussed benign etiology and prognosis. - Observe - Call for any changes  Melanocytic Nevi - Tan-brown and/or pink-flesh-colored symmetric macules and papules - Benign appearing on exam today -  Observation - Call clinic for new or changing moles - Recommend daily use of broad spectrum spf 30+ sunscreen to sun-exposed areas.  - trunk  Hemangiomas - Red papules - Discussed benign nature - Observe - Call for any changes - legs, trunk  Actinic Damage - Chronic condition, secondary to cumulative UV/sun exposure - diffuse scaly erythematous macules with underlying dyspigmentation - Recommend daily broad spectrum sunscreen SPF 30+ to sun-exposed areas, reapply every 2 hours as needed.  - Staying in the shade or wearing long sleeves, sun glasses (UVA+UVB protection) and wide brim hats (4-inch brim around the entire circumference of the hat) are also recommended for sun protection.  - Call for new or changing lesions.  Skin cancer screening performed today.   Keratosis Pilaris - Tiny follicular keratotic papules - Benign. Genetic in nature. No cure. - Observe. - If desired, patient can use an emollient (moisturizer) containing ammonium lactate, urea or salicylic acid once a day to smooth the area   Fibrous papule of face R nose  Stable Benign-appearing.  Observation.  Call clinic for new or changing lesions.  Recommend daily use of broad spectrum spf 30+ sunscreen to sun-exposed areas.     Return in about 6 months (around 11/15/2022) for TBSE, Hx of Dysplastic nevi.  I, [name], RMA, am acting as scribe for Sarina Ser, MD . Documentation: I have reviewed the above documentation for accuracy and completeness, and I agree with the above.  Sarina Ser, MD

## 2022-05-18 ENCOUNTER — Encounter: Payer: Self-pay | Admitting: Dermatology

## 2022-09-08 DIAGNOSIS — Z131 Encounter for screening for diabetes mellitus: Secondary | ICD-10-CM | POA: Diagnosis not present

## 2022-09-08 DIAGNOSIS — Z1329 Encounter for screening for other suspected endocrine disorder: Secondary | ICD-10-CM | POA: Diagnosis not present

## 2022-09-08 DIAGNOSIS — Z125 Encounter for screening for malignant neoplasm of prostate: Secondary | ICD-10-CM | POA: Diagnosis not present

## 2022-09-08 DIAGNOSIS — Z0000 Encounter for general adult medical examination without abnormal findings: Secondary | ICD-10-CM | POA: Diagnosis not present

## 2022-09-08 DIAGNOSIS — Z13220 Encounter for screening for lipoid disorders: Secondary | ICD-10-CM | POA: Diagnosis not present

## 2022-09-12 DIAGNOSIS — Z131 Encounter for screening for diabetes mellitus: Secondary | ICD-10-CM | POA: Diagnosis not present

## 2022-09-12 DIAGNOSIS — Z0000 Encounter for general adult medical examination without abnormal findings: Secondary | ICD-10-CM | POA: Diagnosis not present

## 2022-09-12 DIAGNOSIS — Z13220 Encounter for screening for lipoid disorders: Secondary | ICD-10-CM | POA: Diagnosis not present

## 2022-09-12 DIAGNOSIS — Z125 Encounter for screening for malignant neoplasm of prostate: Secondary | ICD-10-CM | POA: Diagnosis not present

## 2022-11-13 ENCOUNTER — Ambulatory Visit: Payer: 59 | Admitting: Dermatology

## 2022-11-13 VITALS — BP 97/61 | HR 79

## 2022-11-13 DIAGNOSIS — D2222 Melanocytic nevi of left ear and external auricular canal: Secondary | ICD-10-CM

## 2022-11-13 DIAGNOSIS — Z1283 Encounter for screening for malignant neoplasm of skin: Secondary | ICD-10-CM | POA: Diagnosis not present

## 2022-11-13 DIAGNOSIS — L578 Other skin changes due to chronic exposure to nonionizing radiation: Secondary | ICD-10-CM | POA: Diagnosis not present

## 2022-11-13 DIAGNOSIS — L821 Other seborrheic keratosis: Secondary | ICD-10-CM

## 2022-11-13 DIAGNOSIS — D2221 Melanocytic nevi of right ear and external auricular canal: Secondary | ICD-10-CM | POA: Diagnosis not present

## 2022-11-13 DIAGNOSIS — Z86018 Personal history of other benign neoplasm: Secondary | ICD-10-CM | POA: Diagnosis not present

## 2022-11-13 DIAGNOSIS — L814 Other melanin hyperpigmentation: Secondary | ICD-10-CM | POA: Diagnosis not present

## 2022-11-13 DIAGNOSIS — D2239 Melanocytic nevi of other parts of face: Secondary | ICD-10-CM | POA: Diagnosis not present

## 2022-11-13 DIAGNOSIS — D229 Melanocytic nevi, unspecified: Secondary | ICD-10-CM

## 2022-11-13 NOTE — Progress Notes (Signed)
Follow-Up Visit   Subjective  Victor Mora is a 51 y.o. male who presents for the following: Annual Exam. Hx of Dysplastic nevus.  The patient presents for Total-Body Skin Exam (TBSE) for skin cancer screening and mole check.  The patient has spots, moles and lesions to be evaluated, some may be new or changing and the patient has concerns that these could be cancer.   The following portions of the chart were reviewed this encounter and updated as appropriate:   Tobacco  Allergies  Meds  Problems  Med Hx  Surg Hx  Fam Hx     Review of Systems:  No other skin or systemic complaints except as noted in HPI or Assessment and Plan.  Objective  Well appearing patient in no apparent distress; mood and affect are within normal limits.  A full examination was performed including scalp, head, eyes, ears, nose, lips, neck, chest, axillae, abdomen, back, buttocks, bilateral upper extremities, bilateral lower extremities, hands, feet, fingers, toes, fingernails, and toenails. All findings within normal limits unless otherwise noted below.  right nose 0.3 cm Flesh papule   Left Ear anti-helix, right helix 0.3 cm brown macule at the right helix  0.2 cm brown macule at the left anti-helix    Assessment & Plan  Fibrous papule of nose right nose See photos- no changes  The patient will observe these symptoms, and report promptly any worsening or unexpected persistence.  If well, may return prn.   Nevus (2) Left Ear anti-helix; right helix See photos- no changes  Benign-appearing.  Observation.  Call clinic for new or changing lesions.  Recommend daily use of broad spectrum spf 30+ sunscreen to sun-exposed areas.    Lentigines - Scattered tan macules - Due to sun exposure - Benign-appearing, observe - Recommend daily broad spectrum sunscreen SPF 30+ to sun-exposed areas, reapply every 2 hours as needed. - Call for any changes  Seborrheic Keratoses - Stuck-on, waxy, tan-brown  papules and/or plaques  - Benign-appearing - Discussed benign etiology and prognosis. - Observe - Call for any changes  Melanocytic Nevi - Tan-brown and/or pink-flesh-colored symmetric macules and papules - Benign appearing on exam today - Observation - Call clinic for new or changing moles - Recommend daily use of broad spectrum spf 30+ sunscreen to sun-exposed areas.   Hemangiomas - Red papules - Discussed benign nature - Observe - Call for any changes  Actinic Damage - Chronic condition, secondary to cumulative UV/sun exposure - diffuse scaly erythematous macules with underlying dyspigmentation - Recommend daily broad spectrum sunscreen SPF 30+ to sun-exposed areas, reapply every 2 hours as needed.  - Staying in the shade or wearing long sleeves, sun glasses (UVA+UVB protection) and wide brim hats (4-inch brim around the entire circumference of the hat) are also recommended for sun protection.  - Call for new or changing lesions.  History of Dysplastic Nevi Multiple see history  - No evidence of recurrence today - Recommend regular full body skin exams - Recommend daily broad spectrum sunscreen SPF 30+ to sun-exposed areas, reapply every 2 hours as needed.  - Call if any new or changing lesions are noted between office visits   Skin cancer screening performed today.   Return in about 6 months (around 05/15/2023) for TBSE, hx of Dysplastic nevus .  IMarye Round, CMA, am acting as scribe for Sarina Ser, MD .  Documentation: I have reviewed the above documentation for accuracy and completeness, and I agree with the above.  Shanon Brow  Nehemiah Massed, MD

## 2022-11-13 NOTE — Patient Instructions (Signed)
Due to recent changes in healthcare laws, you may see results of your pathology and/or laboratory studies on MyChart before the doctors have had a chance to review them. We understand that in some cases there may be results that are confusing or concerning to you. Please understand that not all results are received at the same time and often the doctors may need to interpret multiple results in order to provide you with the best plan of care or course of treatment. Therefore, we ask that you please give us 2 business days to thoroughly review all your results before contacting the office for clarification. Should we see a critical lab result, you will be contacted sooner.   If You Need Anything After Your Visit  If you have any questions or concerns for your doctor, please call our main line at [phone] and press option 4 to reach your doctor's medical assistant. If no one answers, please leave a voicemail as directed and we will return your call as soon as possible. Messages left after 4 pm will be answered the following business day.   You may also send us a message via MyChart. We typically respond to MyChart messages within 1-2 business days.  For prescription refills, please ask your pharmacy to contact our office. Our fax number is [phone].  If you have an urgent issue when the clinic is closed that cannot wait until the next business day, you can page your doctor at the number below.    Please note that while we do our best to be available for urgent issues outside of office hours, we are not available 24/7.   If you have an urgent issue and are unable to reach us, you may choose to seek medical care at your doctor's office, retail clinic, urgent care center, or emergency room.  If you have a medical emergency, please immediately call 911 or go to the emergency department.  Pager Numbers  - Dr. Kowalski: [phone]  - Dr. Moye: [phone]  - Dr. Stewart:  [phone]  In the event of inclement weather, please call our main line at [phone] for an update on the status of any delays or closures.  Dermatology Medication Tips: Please keep the boxes that topical medications come in in order to help keep track of the instructions about where and how to use these. Pharmacies typically print the medication instructions only on the boxes and not directly on the medication tubes.   If your medication is too expensive, please contact our office at [phone] option 4 or send us a message through MyChart.   We are unable to tell what your co-pay for medications will be in advance as this is different depending on your insurance coverage. However, we may be able to find a substitute medication at lower cost or fill out paperwork to get insurance to cover a needed medication.   If a prior authorization is required to get your medication covered by your insurance company, please allow us 1-2 business days to complete this process.  Drug prices often vary depending on where the prescription is filled and some pharmacies may offer cheaper prices.  The website www.goodrx.com contains coupons for medications through different pharmacies. The prices here do not account for what the cost may be with help from insurance (it may be cheaper with your insurance), but the website can give you the price if you did not use any insurance.  - You can print the associated coupon and take it with   your prescription to the pharmacy.  - You may also stop by our office during regular business hours and pick up a GoodRx coupon card.  - If you need your prescription sent electronically to a different pharmacy, notify our office through [facility] MyChart or by phone at [phone] option 4.     Si Usted Necesita Algo Despus de Su Visita  Tambin puede enviarnos un mensaje a travs de MyChart. Por lo general respondemos a los mensajes de MyChart en el transcurso de 1 a 2  das hbiles.  Para renovar recetas, por favor pida a su farmacia que se ponga en contacto con nuestra oficina. Nuestro nmero de fax es el [phone].  Si tiene un asunto urgente cuando la clnica est cerrada y que no puede esperar hasta el siguiente da hbil, puede llamar/localizar a su doctor(a) al nmero que aparece a continuacin.   Por favor, tenga en cuenta que aunque hacemos todo lo posible para estar disponibles para asuntos urgentes fuera del horario de oficina, no estamos disponibles las 24 horas del da, los 7 das de la semana.   Si tiene un problema urgente y no puede comunicarse con nosotros, puede optar por buscar atencin mdica  en el consultorio de su doctor(a), en una clnica privada, en un centro de atencin urgente o en una sala de emergencias.  Si tiene una emergencia mdica, por favor llame inmediatamente al 911 o vaya a la sala de emergencias.  Nmeros de bper  - Dr. Kowalski: [phone]  - Dra. Moye: [phone]  - Dra. Stewart: [phone]  En caso de inclemencias del tiempo, por favor llame a nuestra lnea principal al [phone] para una actualizacin sobre el estado de cualquier retraso o cierre.  Consejos para la medicacin en dermatologa: Por favor, guarde las cajas en las que vienen los medicamentos de uso tpico para ayudarle a seguir las instrucciones sobre dnde y cmo usarlos. Las farmacias generalmente imprimen las instrucciones del medicamento slo en las cajas y no directamente en los tubos del medicamento.   Si su medicamento es muy caro, por favor, pngase en contacto con nuestra oficina llamando al [phone] y presione la opcin 4 o envenos un mensaje a travs de MyChart.   No podemos decirle cul ser su copago por los medicamentos por adelantado ya que esto es diferente dependiendo de la cobertura de su seguro. Sin embargo, es posible que podamos encontrar un medicamento sustituto a menor costo o llenar un formulario para que el  seguro cubra el medicamento que se considera necesario.   Si se requiere una autorizacin previa para que su compaa de seguros cubra su medicamento, por favor permtanos de 1 a 2 das hbiles para completar este proceso.  Los precios de los medicamentos varan con frecuencia dependiendo del lugar de dnde se surte la receta y alguna farmacias pueden ofrecer precios ms baratos.  El sitio web www.goodrx.com tiene cupones para medicamentos de diferentes farmacias. Los precios aqu no tienen en cuenta lo que podra costar con la ayuda del seguro (puede ser ms barato con su seguro), pero el sitio web puede darle el precio si no utiliz ningn seguro.  - Puede imprimir el cupn correspondiente y llevarlo con su receta a la farmacia.  - Tambin puede pasar por nuestra oficina durante el horario de atencin regular y recoger una tarjeta de cupones de GoodRx.  - Si necesita que su receta se enve electrnicamente a una farmacia diferente, informe a nuestra oficina a travs de MyChart de [place]   o por telfono llamando al [phone] y presione la opcin 4.  

## 2022-11-24 ENCOUNTER — Encounter: Payer: Self-pay | Admitting: Dermatology

## 2023-03-04 IMAGING — US US ABDOMEN LIMITED
1 series · 14 of 25 positions shown · non-contrast
Comparison: None.

CLINICAL DATA: Upper abdominal pain for 2 days

EXAM:
ULTRASOUND ABDOMEN LIMITED RIGHT UPPER QUADRANT

[Series 1: us abdomen limited ruq (liver/gb) · 14 of 40 slices shown]
[im 1/40]
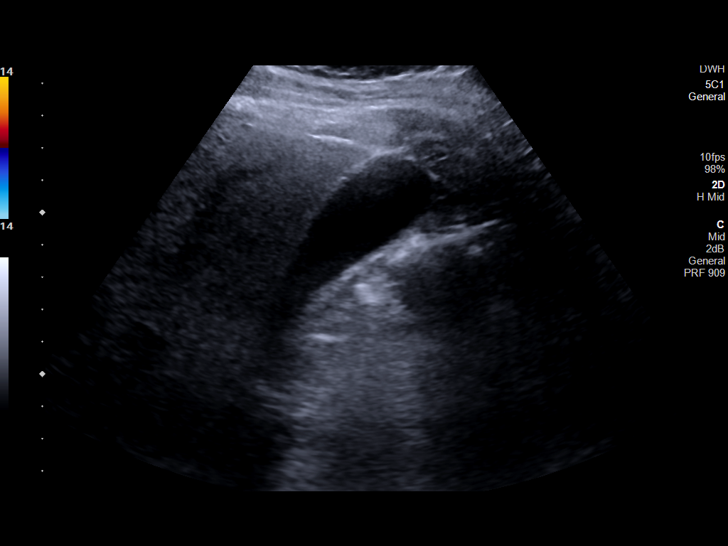
[im 4/40]
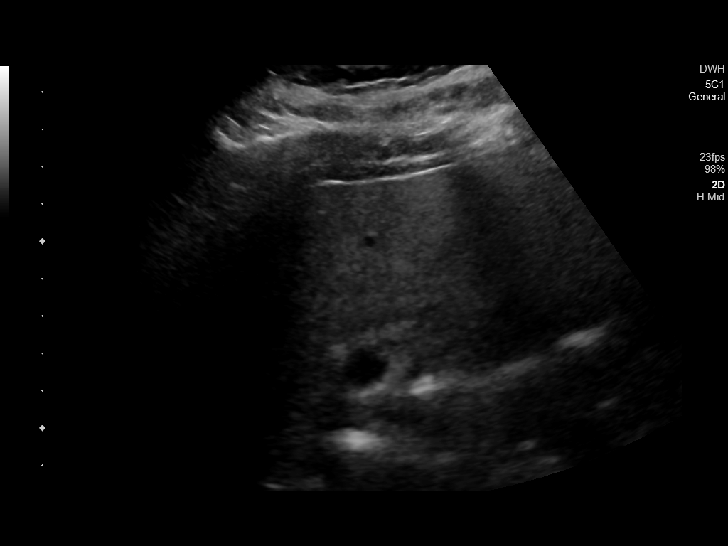
[im 7/40]
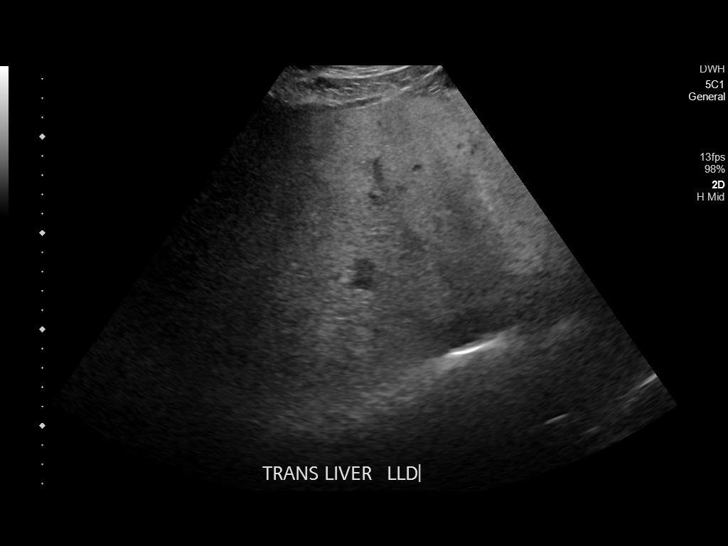
[im 10/40]
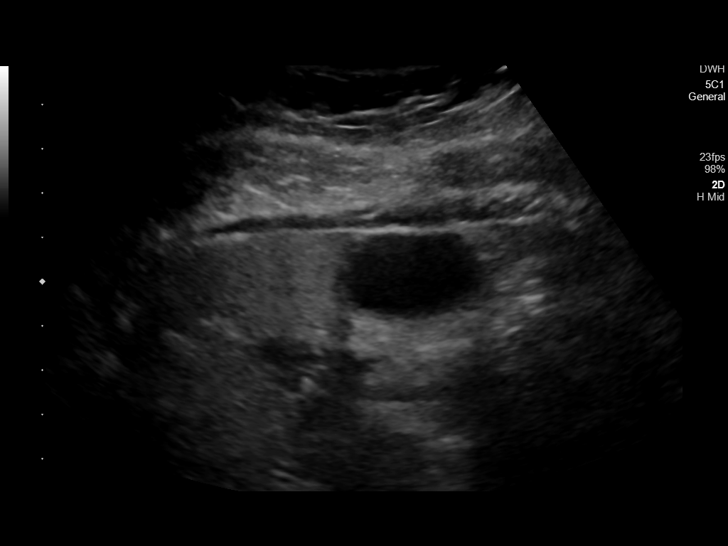
[im 14/40]
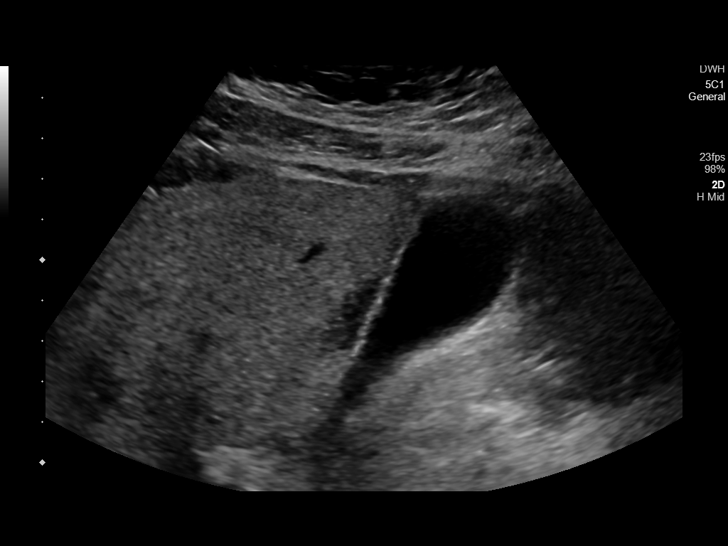
[im 15/40]
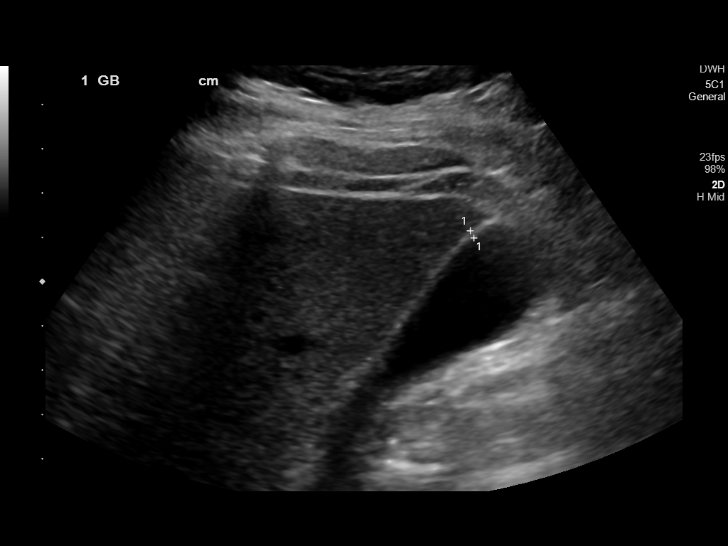
[im 18/40]
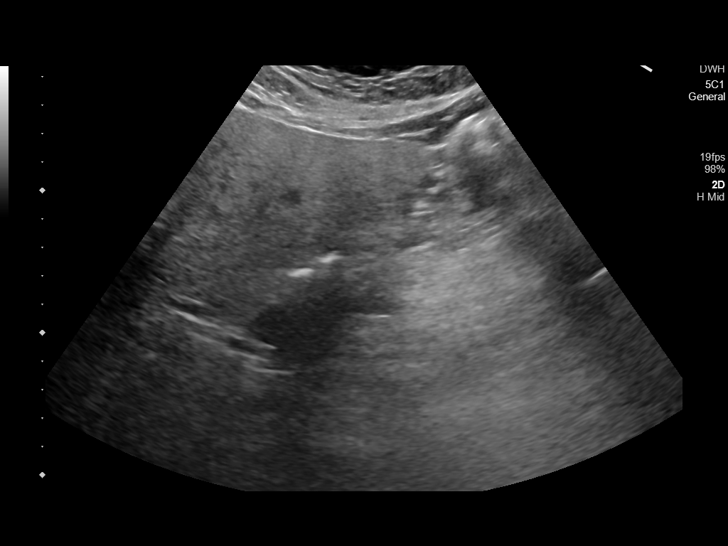
[im 22/40]
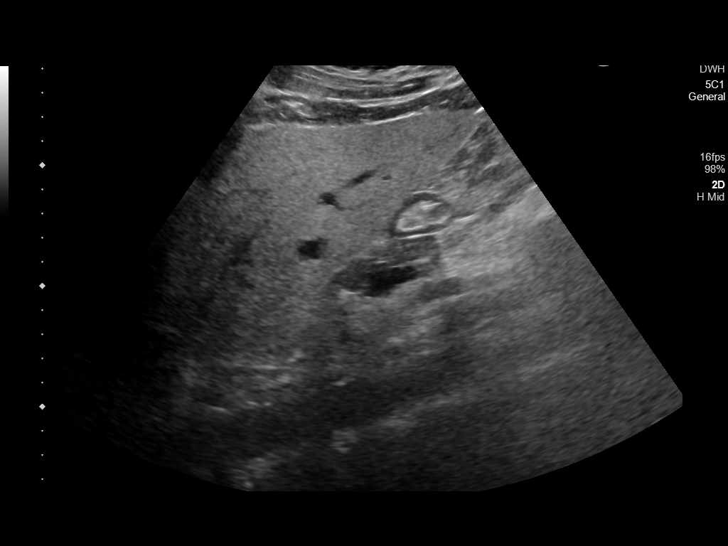
[im 25/40]
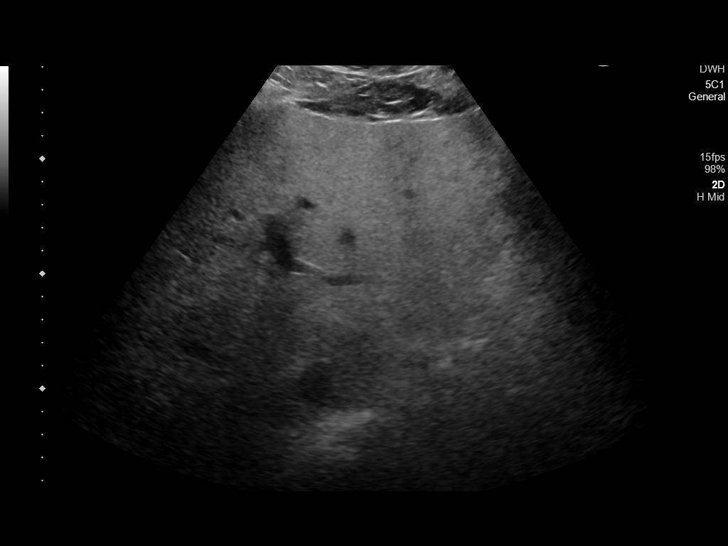
[im 27/40]
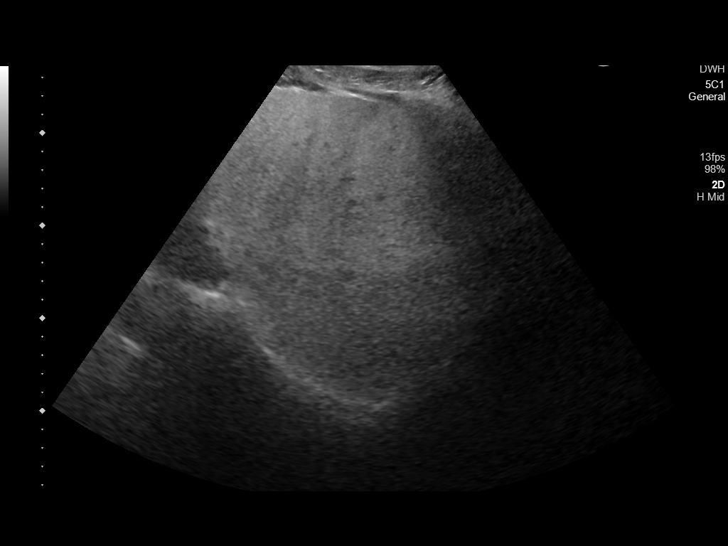
[im 30/40]
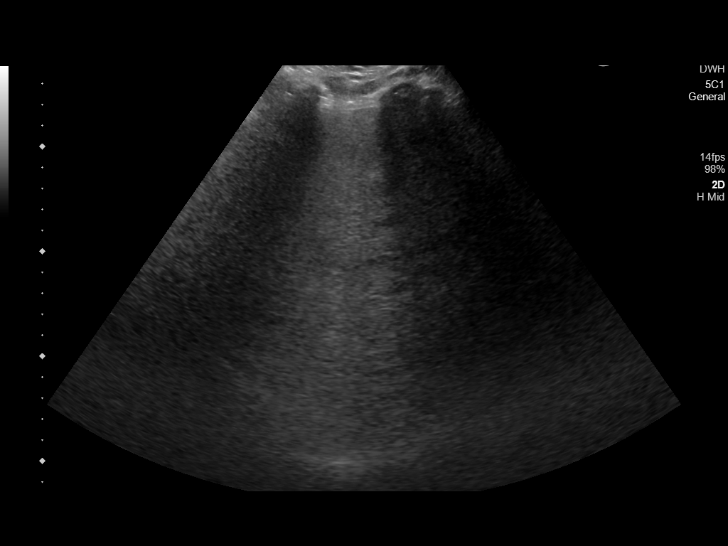
[im 33/40]
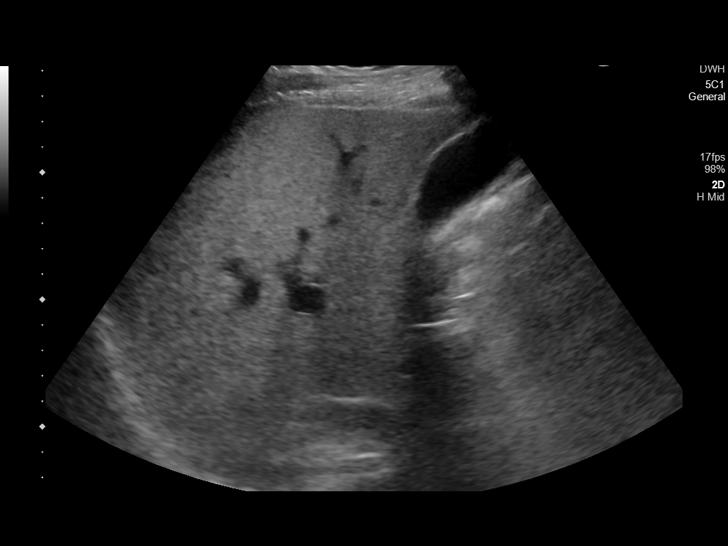
[im 36/40]
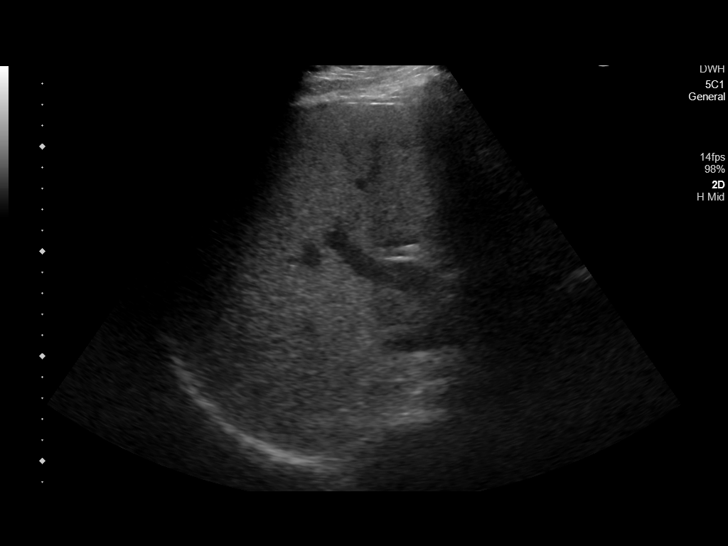
[im 40/40]
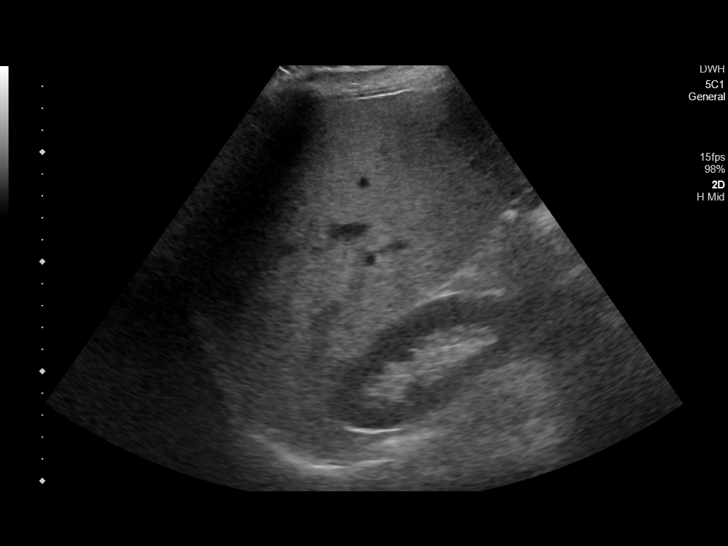

[14 of 25 positions shown; findings below may reference images not displayed]

FINDINGS: Gallbladder:

No gallstones or wall thickening visualized. No sonographic Murphy
sign noted by sonographer.

Common bile duct:

Diameter: 4.3 mm.

Liver:

Diffusely increased in echogenicity consistent with fatty
infiltration. No focal mass is noted. Portal vein is patent on color
Doppler imaging with normal direction of blood flow towards the
liver.

Other: None.
IMPRESSION: Fatty liver.

No other focal abnormality is noted.

## 2023-05-16 ENCOUNTER — Ambulatory Visit: Payer: 59 | Admitting: Dermatology

## 2023-05-16 ENCOUNTER — Encounter: Payer: Self-pay | Admitting: Dermatology

## 2023-05-16 VITALS — BP 127/79 | HR 77

## 2023-05-16 DIAGNOSIS — D2239 Melanocytic nevi of other parts of face: Secondary | ICD-10-CM

## 2023-05-16 DIAGNOSIS — L821 Other seborrheic keratosis: Secondary | ICD-10-CM | POA: Diagnosis not present

## 2023-05-16 DIAGNOSIS — W908XXA Exposure to other nonionizing radiation, initial encounter: Secondary | ICD-10-CM

## 2023-05-16 DIAGNOSIS — D2222 Melanocytic nevi of left ear and external auricular canal: Secondary | ICD-10-CM

## 2023-05-16 DIAGNOSIS — L814 Other melanin hyperpigmentation: Secondary | ICD-10-CM | POA: Diagnosis not present

## 2023-05-16 DIAGNOSIS — Z1283 Encounter for screening for malignant neoplasm of skin: Secondary | ICD-10-CM

## 2023-05-16 DIAGNOSIS — D2221 Melanocytic nevi of right ear and external auricular canal: Secondary | ICD-10-CM

## 2023-05-16 DIAGNOSIS — Z86018 Personal history of other benign neoplasm: Secondary | ICD-10-CM

## 2023-05-16 DIAGNOSIS — D229 Melanocytic nevi, unspecified: Secondary | ICD-10-CM

## 2023-05-16 DIAGNOSIS — L578 Other skin changes due to chronic exposure to nonionizing radiation: Secondary | ICD-10-CM

## 2023-05-16 NOTE — Progress Notes (Signed)
   Follow-Up Visit   Subjective  Victor Mora is a 52 y.o. male who presents for the following: Skin Cancer Screening and Full Body Skin Exam Hx of dysplastic nevi The patient presents for Total-Body Skin Exam (TBSE) for skin cancer screening and mole check. The patient has spots, moles and lesions to be evaluated, some may be new or changing and the patient has concerns that these could be cancer.  The following portions of the chart were reviewed this encounter and updated as appropriate: medications, allergies, medical history  Review of Systems:  No other skin or systemic complaints except as noted in HPI or Assessment and Plan.  Objective  Well appearing patient in no apparent distress; mood and affect are within normal limits. A full examination was performed including scalp, head, eyes, ears, nose, lips, neck, chest, axillae, abdomen, back, buttocks, bilateral upper extremities, bilateral lower extremities, hands, feet, fingers, toes, fingernails, and toenails. All findings within normal limits unless otherwise noted below.   Relevant physical exam findings are noted in the Assessment and Plan.   Assessment & Plan   LENTIGINES, SEBORRHEIC KERATOSES, HEMANGIOMAS - Benign normal skin lesions - Benign-appearing - Call for any changes  Fibrous papule of nose right nose 0.3 cm Flesh papule  See photos- no changes  The patient will observe these symptoms, and report promptly any worsening or unexpected persistence.  If well, may return prn.   MELANOCYTIC NEVI - Tan-brown and/or pink-flesh-colored symmetric macules and papules - Benign appearing on exam today - Observation - Call clinic for new or changing moles - Recommend daily use of broad spectrum spf 30+ sunscreen to sun-exposed areas.   Nevus (2) Left Ear anti-helix; right helix 0.3 cm brown macule at the right helix  0.2 cm brown macule at the left anti-helix  See photos- no changes  Benign-appearing.  Observation.   Call clinic for new or changing lesions.  Recommend daily use of broad spectrum spf 30+ sunscreen to sun-exposed areas.    ACTINIC DAMAGE - Chronic condition, secondary to cumulative UV/sun exposure - diffuse scaly erythematous macules with underlying dyspigmentation - Recommend daily broad spectrum sunscreen SPF 30+ to sun-exposed areas, reapply every 2 hours as needed.  - Staying in the shade or wearing long sleeves, sun glasses (UVA+UVB protection) and wide brim hats (4-inch brim around the entire circumference of the hat) are also recommended for sun protection.  - Call for new or changing lesions.  HISTORY OF DYSPLASTIC NEVUS Multiple locations see history  No evidence of recurrence today Recommend regular full body skin exams Recommend daily broad spectrum sunscreen SPF 30+ to sun-exposed areas, reapply every 2 hours as needed.  Call if any new or changing lesions are noted between office visits  SKIN CANCER SCREENING PERFORMED TODAY.   Return in about 6 months (around 11/15/2023) for TBSE.  IAsher Muir, CMA, am acting as scribe for Armida Sans, MD.  Documentation: I have reviewed the above documentation for accuracy and completeness, and I agree with the above.  Armida Sans, MD

## 2023-05-16 NOTE — Patient Instructions (Addendum)
     Melanoma ABCDEs  Melanoma is the most dangerous type of skin cancer, and is the leading cause of death from skin disease.  You are more likely to develop melanoma if you: Have light-colored skin, light-colored eyes, or red or blond hair Spend a lot of time in the sun Tan regularly, either outdoors or in a tanning bed Have had blistering sunburns, especially during childhood Have a close family member who has had a melanoma Have atypical moles or large birthmarks  Early detection of melanoma is key since treatment is typically straightforward and cure rates are extremely high if we catch it early.   The first sign of melanoma is often a change in a mole or a new dark spot.  The ABCDE system is a way of remembering the signs of melanoma.  A for asymmetry:  The two halves do not match. B for border:  The edges of the growth are irregular. C for color:  A mixture of colors are present instead of an even brown color. D for diameter:  Melanomas are usually (but not always) greater than 6mm - the size of a pencil eraser. E for evolution:  The spot keeps changing in size, shape, and color.  Please check your skin once per month between visits. You can use a small mirror in front and a large mirror behind you to keep an eye on the back side or your body.   If you see any new or changing lesions before your next follow-up, please call to schedule a visit.  Please continue daily skin protection including broad spectrum sunscreen SPF 30+ to sun-exposed areas, reapplying every 2 hours as needed when you're outdoors.   Staying in the shade or wearing long sleeves, sun glasses (UVA+UVB protection) and wide brim hats (4-inch brim around the entire circumference of the hat) are also recommended for sun protection.    Due to recent changes in healthcare laws, you may see results of your pathology and/or laboratory studies on MyChart before the doctors have had a chance to review them. We  understand that in some cases there may be results that are confusing or concerning to you. Please understand that not all results are received at the same time and often the doctors may need to interpret multiple results in order to provide you with the best plan of care or course of treatment. Therefore, we ask that you please give us 2 business days to thoroughly review all your results before contacting the office for clarification. Should we see a critical lab result, you will be contacted sooner.   If You Need Anything After Your Visit  If you have any questions or concerns for your doctor, please call our main line at [phone] and press option 4 to reach your doctor's medical assistant. If no one answers, please leave a voicemail as directed and we will return your call as soon as possible. Messages left after 4 pm will be answered the following business day.   You may also send us a message via MyChart. We typically respond to MyChart messages within 1-2 business days.  For prescription refills, please ask your pharmacy to contact our office. Our fax number is [phone].  If you have an urgent issue when the clinic is closed that cannot wait until the next business day, you can page your doctor at the number below.    Please note that while we do our best to be available for urgent issues   outside of office hours, we are not available 24/7.   If you have an urgent issue and are unable to reach us, you may choose to seek medical care at your doctor's office, retail clinic, urgent care center, or emergency room.  If you have a medical emergency, please immediately call 911 or go to the emergency department.  Pager Numbers  - Dr. Kowalski: [phone]  - Dr. Moye: [phone]  - Dr. Stewart: [phone]  In the event of inclement weather, please call our main line at [phone] for an update on the status of any delays or closures.  Dermatology Medication Tips: Please  keep the boxes that topical medications come in in order to help keep track of the instructions about where and how to use these. Pharmacies typically print the medication instructions only on the boxes and not directly on the medication tubes.   If your medication is too expensive, please contact our office at [phone] option 4 or send us a message through MyChart.   We are unable to tell what your co-pay for medications will be in advance as this is different depending on your insurance coverage. However, we may be able to find a substitute medication at lower cost or fill out paperwork to get insurance to cover a needed medication.   If a prior authorization is required to get your medication covered by your insurance company, please allow us 1-2 business days to complete this process.  Drug prices often vary depending on where the prescription is filled and some pharmacies may offer cheaper prices.  The website www.goodrx.com contains coupons for medications through different pharmacies. The prices here do not account for what the cost may be with help from insurance (it may be cheaper with your insurance), but the website can give you the price if you did not use any insurance.  - You can print the associated coupon and take it with your prescription to the pharmacy.  - You may also stop by our office during regular business hours and pick up a GoodRx coupon card.  - If you need your prescription sent electronically to a different pharmacy, notify our office through  MyChart or by phone at [phone] option 4.     Si Usted Necesita Algo Despus de Su Visita  Tambin puede enviarnos un mensaje a travs de MyChart. Por lo general respondemos a los mensajes de MyChart en el transcurso de 1 a 2 das hbiles.  Para renovar recetas, por favor pida a su farmacia que se ponga en contacto con nuestra oficina. Nuestro nmero de fax es el [phone].  Si tiene un asunto urgente  cuando la clnica est cerrada y que no puede esperar hasta el siguiente da hbil, puede llamar/localizar a su doctor(a) al nmero que aparece a continuacin.   Por favor, tenga en cuenta que aunque hacemos todo lo posible para estar disponibles para asuntos urgentes fuera del horario de oficina, no estamos disponibles las 24 horas del da, los 7 das de la semana.   Si tiene un problema urgente y no puede comunicarse con nosotros, puede optar por buscar atencin mdica  en el consultorio de su doctor(a), en una clnica privada, en un centro de atencin urgente o en una sala de emergencias.  Si tiene una emergencia mdica, por favor llame inmediatamente al 911 o vaya a la sala de emergencias.  Nmeros de bper  - Dr. Kowalski: [phone]  - Dra. Moye: [phone]  - Dra. Stewart: [phone]  En caso   de inclemencias del tiempo, por favor llame a nuestra lnea principal al [phone] para una actualizacin sobre el estado de cualquier retraso o cierre.  Consejos para la medicacin en dermatologa: Por favor, guarde las cajas en las que vienen los medicamentos de uso tpico para ayudarle a seguir las instrucciones sobre dnde y cmo usarlos. Las farmacias generalmente imprimen las instrucciones del medicamento slo en las cajas y no directamente en los tubos del medicamento.   Si su medicamento es muy caro, por favor, pngase en contacto con nuestra oficina llamando al [phone] y presione la opcin 4 o envenos un mensaje a travs de MyChart.   No podemos decirle cul ser su copago por los medicamentos por adelantado ya que esto es diferente dependiendo de la cobertura de su seguro. Sin embargo, es posible que podamos encontrar un medicamento sustituto a menor costo o llenar un formulario para que el seguro cubra el medicamento que se considera necesario.   Si se requiere una autorizacin previa para que su compaa de seguros cubra su medicamento, por favor permtanos de 1 a 2  das hbiles para completar este proceso.  Los precios de los medicamentos varan con frecuencia dependiendo del lugar de dnde se surte la receta y alguna farmacias pueden ofrecer precios ms baratos.  El sitio web www.goodrx.com tiene cupones para medicamentos de diferentes farmacias. Los precios aqu no tienen en cuenta lo que podra costar con la ayuda del seguro (puede ser ms barato con su seguro), pero el sitio web puede darle el precio si no utiliz ningn seguro.  - Puede imprimir el cupn correspondiente y llevarlo con su receta a la farmacia.  - Tambin puede pasar por nuestra oficina durante el horario de atencin regular y recoger una tarjeta de cupones de GoodRx.  - Si necesita que su receta se enve electrnicamente a una farmacia diferente, informe a nuestra oficina a travs de MyChart de [facility] o por telfono llamando al [phone] y presione la opcin 4.  

## 2023-05-18 ENCOUNTER — Encounter: Payer: Self-pay | Admitting: Dermatology

## 2023-12-03 ENCOUNTER — Ambulatory Visit: Payer: 59 | Admitting: Dermatology

## 2023-12-05 ENCOUNTER — Ambulatory Visit: Payer: 59 | Admitting: Dermatology
